# Patient Record
Sex: Female | Born: 1975 | Race: Black or African American | Hispanic: No | Marital: Married | Smoking: Current every day smoker
Health system: Southern US, Community
[De-identification: ages and names within clinical notes are randomized; demographics above are authoritative.]

## PROBLEM LIST (undated history)

## (undated) DIAGNOSIS — I10 Essential (primary) hypertension: Secondary | ICD-10-CM

## (undated) HISTORY — PX: BACK SURGERY: SHX140

---

## 1998-03-22 ENCOUNTER — Encounter: Admission: RE | Admit: 1998-03-22 | Discharge: 1998-03-22 | Payer: Self-pay | Admitting: Family Medicine

## 1998-04-03 ENCOUNTER — Encounter: Admission: RE | Admit: 1998-04-03 | Discharge: 1998-04-03 | Payer: Self-pay | Admitting: Family Medicine

## 1998-04-10 ENCOUNTER — Encounter: Admission: RE | Admit: 1998-04-10 | Discharge: 1998-04-10 | Payer: Self-pay | Admitting: Family Medicine

## 1998-04-22 ENCOUNTER — Encounter: Admission: RE | Admit: 1998-04-22 | Discharge: 1998-04-22 | Payer: Self-pay | Admitting: Family Medicine

## 1998-05-07 ENCOUNTER — Encounter: Admission: RE | Admit: 1998-05-07 | Discharge: 1998-05-07 | Payer: Self-pay | Admitting: Family Medicine

## 1998-05-15 ENCOUNTER — Encounter: Admission: RE | Admit: 1998-05-15 | Discharge: 1998-05-15 | Payer: Self-pay | Admitting: Family Medicine

## 1998-06-11 ENCOUNTER — Encounter: Admission: RE | Admit: 1998-06-11 | Discharge: 1998-06-11 | Payer: Self-pay | Admitting: Family Medicine

## 1998-06-18 ENCOUNTER — Encounter: Admission: RE | Admit: 1998-06-18 | Discharge: 1998-06-18 | Payer: Self-pay | Admitting: Family Medicine

## 1998-06-26 ENCOUNTER — Ambulatory Visit (HOSPITAL_COMMUNITY): Admission: RE | Admit: 1998-06-26 | Discharge: 1998-06-26 | Payer: Self-pay | Admitting: Family Medicine

## 1998-06-29 ENCOUNTER — Inpatient Hospital Stay (HOSPITAL_COMMUNITY): Admission: AD | Admit: 1998-06-29 | Discharge: 1998-06-29 | Payer: Self-pay | Admitting: Obstetrics & Gynecology

## 1998-07-05 ENCOUNTER — Encounter: Admission: RE | Admit: 1998-07-05 | Discharge: 1998-07-05 | Payer: Self-pay | Admitting: Family Medicine

## 1998-07-23 ENCOUNTER — Encounter: Admission: RE | Admit: 1998-07-23 | Discharge: 1998-07-23 | Payer: Self-pay | Admitting: Sports Medicine

## 1998-08-07 ENCOUNTER — Encounter: Admission: RE | Admit: 1998-08-07 | Discharge: 1998-08-07 | Payer: Self-pay | Admitting: Family Medicine

## 1998-08-07 ENCOUNTER — Inpatient Hospital Stay (HOSPITAL_COMMUNITY): Admission: AD | Admit: 1998-08-07 | Discharge: 1998-08-07 | Payer: Self-pay | Admitting: Obstetrics

## 1998-08-22 ENCOUNTER — Encounter: Admission: RE | Admit: 1998-08-22 | Discharge: 1998-08-22 | Payer: Self-pay | Admitting: Family Medicine

## 1998-08-30 ENCOUNTER — Encounter: Admission: RE | Admit: 1998-08-30 | Discharge: 1998-08-30 | Payer: Self-pay | Admitting: Family Medicine

## 1998-09-04 ENCOUNTER — Encounter: Admission: RE | Admit: 1998-09-04 | Discharge: 1998-09-04 | Payer: Self-pay | Admitting: Family Medicine

## 1998-09-09 ENCOUNTER — Encounter: Admission: RE | Admit: 1998-09-09 | Discharge: 1998-09-09 | Payer: Self-pay | Admitting: Family Medicine

## 1998-09-09 ENCOUNTER — Encounter: Admission: RE | Admit: 1998-09-09 | Discharge: 1998-12-08 | Payer: Self-pay | Admitting: Family Medicine

## 1998-10-07 ENCOUNTER — Inpatient Hospital Stay (HOSPITAL_COMMUNITY): Admission: RE | Admit: 1998-10-07 | Discharge: 1998-10-07 | Payer: Self-pay | Admitting: Obstetrics

## 1998-10-10 ENCOUNTER — Encounter: Admission: RE | Admit: 1998-10-10 | Discharge: 1998-10-10 | Payer: Self-pay | Admitting: Obstetrics

## 1998-10-14 ENCOUNTER — Encounter (HOSPITAL_COMMUNITY): Admission: RE | Admit: 1998-10-14 | Discharge: 1999-01-12 | Payer: Self-pay | Admitting: Obstetrics & Gynecology

## 1998-10-15 ENCOUNTER — Encounter: Admission: RE | Admit: 1998-10-15 | Discharge: 1998-10-15 | Payer: Self-pay | Admitting: Sports Medicine

## 1998-10-17 ENCOUNTER — Encounter: Admission: RE | Admit: 1998-10-17 | Discharge: 1998-10-17 | Payer: Self-pay | Admitting: Obstetrics

## 1998-10-28 ENCOUNTER — Ambulatory Visit (HOSPITAL_COMMUNITY): Admission: RE | Admit: 1998-10-28 | Discharge: 1998-10-28 | Payer: Self-pay | Admitting: Obstetrics

## 1998-11-03 ENCOUNTER — Inpatient Hospital Stay (HOSPITAL_COMMUNITY): Admission: AD | Admit: 1998-11-03 | Discharge: 1998-11-03 | Payer: Self-pay | Admitting: Obstetrics

## 1998-11-07 ENCOUNTER — Encounter: Admission: RE | Admit: 1998-11-07 | Discharge: 1998-11-07 | Payer: Self-pay | Admitting: Obstetrics

## 1998-11-14 ENCOUNTER — Encounter: Admission: RE | Admit: 1998-11-14 | Discharge: 1998-11-14 | Payer: Self-pay | Admitting: Obstetrics & Gynecology

## 1998-11-20 ENCOUNTER — Encounter: Admission: RE | Admit: 1998-11-20 | Discharge: 1998-11-20 | Payer: Self-pay | Admitting: Hematology and Oncology

## 1998-11-27 ENCOUNTER — Encounter: Admission: RE | Admit: 1998-11-27 | Discharge: 1998-11-27 | Payer: Self-pay | Admitting: Obstetrics & Gynecology

## 1998-12-04 ENCOUNTER — Inpatient Hospital Stay: Admission: AD | Admit: 1998-12-04 | Discharge: 1998-12-04 | Payer: Self-pay | Admitting: Obstetrics

## 1998-12-12 ENCOUNTER — Encounter: Admission: RE | Admit: 1998-12-12 | Discharge: 1998-12-12 | Payer: Self-pay | Admitting: Obstetrics

## 1998-12-18 ENCOUNTER — Inpatient Hospital Stay (HOSPITAL_COMMUNITY): Admission: AD | Admit: 1998-12-18 | Discharge: 1998-12-18 | Payer: Self-pay | Admitting: Obstetrics

## 1998-12-20 ENCOUNTER — Inpatient Hospital Stay (HOSPITAL_COMMUNITY): Admission: AD | Admit: 1998-12-20 | Discharge: 1998-12-20 | Payer: Self-pay | Admitting: *Deleted

## 1998-12-21 ENCOUNTER — Inpatient Hospital Stay (HOSPITAL_COMMUNITY): Admission: AD | Admit: 1998-12-21 | Discharge: 1998-12-23 | Payer: Self-pay | Admitting: *Deleted

## 1999-01-28 ENCOUNTER — Other Ambulatory Visit: Admission: RE | Admit: 1999-01-28 | Discharge: 1999-01-28 | Payer: Self-pay | Admitting: Family Medicine

## 1999-01-28 ENCOUNTER — Encounter: Admission: RE | Admit: 1999-01-28 | Discharge: 1999-01-28 | Payer: Self-pay | Admitting: Family Medicine

## 1999-02-21 ENCOUNTER — Encounter: Admission: RE | Admit: 1999-02-21 | Discharge: 1999-02-21 | Payer: Self-pay | Admitting: Family Medicine

## 1999-03-24 ENCOUNTER — Encounter: Admission: RE | Admit: 1999-03-24 | Discharge: 1999-03-24 | Payer: Self-pay | Admitting: Family Medicine

## 1999-06-09 ENCOUNTER — Encounter: Admission: RE | Admit: 1999-06-09 | Discharge: 1999-06-09 | Payer: Self-pay | Admitting: Family Medicine

## 1999-06-20 ENCOUNTER — Encounter: Admission: RE | Admit: 1999-06-20 | Discharge: 1999-06-20 | Payer: Self-pay | Admitting: Family Medicine

## 1999-06-27 ENCOUNTER — Encounter: Admission: RE | Admit: 1999-06-27 | Discharge: 1999-06-27 | Payer: Self-pay | Admitting: Family Medicine

## 1999-07-04 ENCOUNTER — Other Ambulatory Visit: Admission: RE | Admit: 1999-07-04 | Discharge: 1999-07-04 | Payer: Self-pay | Admitting: *Deleted

## 1999-07-04 ENCOUNTER — Encounter: Admission: RE | Admit: 1999-07-04 | Discharge: 1999-07-04 | Payer: Self-pay | Admitting: Family Medicine

## 1999-08-19 ENCOUNTER — Encounter: Admission: RE | Admit: 1999-08-19 | Discharge: 1999-08-19 | Payer: Self-pay | Admitting: Family Medicine

## 1999-09-30 ENCOUNTER — Encounter: Admission: RE | Admit: 1999-09-30 | Discharge: 1999-09-30 | Payer: Self-pay | Admitting: Sports Medicine

## 1999-09-30 ENCOUNTER — Other Ambulatory Visit: Admission: RE | Admit: 1999-09-30 | Discharge: 1999-09-30 | Payer: Self-pay | Admitting: Family Medicine

## 1999-10-08 ENCOUNTER — Encounter: Admission: RE | Admit: 1999-10-08 | Discharge: 1999-10-08 | Payer: Self-pay | Admitting: Family Medicine

## 1999-10-15 ENCOUNTER — Encounter: Admission: RE | Admit: 1999-10-15 | Discharge: 1999-10-15 | Payer: Self-pay | Admitting: Family Medicine

## 1999-11-18 ENCOUNTER — Encounter: Admission: RE | Admit: 1999-11-18 | Discharge: 1999-11-18 | Payer: Self-pay | Admitting: Sports Medicine

## 1999-11-21 ENCOUNTER — Encounter: Admission: RE | Admit: 1999-11-21 | Discharge: 1999-11-21 | Payer: Self-pay | Admitting: Family Medicine

## 1999-12-09 ENCOUNTER — Encounter: Admission: RE | Admit: 1999-12-09 | Discharge: 1999-12-09 | Payer: Self-pay | Admitting: Sports Medicine

## 1999-12-17 ENCOUNTER — Encounter: Admission: RE | Admit: 1999-12-17 | Discharge: 1999-12-17 | Payer: Self-pay | Admitting: Family Medicine

## 2000-01-02 ENCOUNTER — Encounter: Admission: RE | Admit: 2000-01-02 | Discharge: 2000-01-02 | Payer: Self-pay | Admitting: Family Medicine

## 2000-01-16 ENCOUNTER — Encounter: Admission: RE | Admit: 2000-01-16 | Discharge: 2000-01-16 | Payer: Self-pay | Admitting: Family Medicine

## 2000-02-18 ENCOUNTER — Encounter: Admission: RE | Admit: 2000-02-18 | Discharge: 2000-02-18 | Payer: Self-pay | Admitting: Family Medicine

## 2000-03-18 ENCOUNTER — Encounter: Admission: RE | Admit: 2000-03-18 | Discharge: 2000-03-18 | Payer: Self-pay | Admitting: Family Medicine

## 2000-03-30 ENCOUNTER — Encounter: Admission: RE | Admit: 2000-03-30 | Discharge: 2000-03-30 | Payer: Self-pay | Admitting: Sports Medicine

## 2000-04-01 ENCOUNTER — Ambulatory Visit (HOSPITAL_COMMUNITY): Admission: RE | Admit: 2000-04-01 | Discharge: 2000-04-01 | Payer: Self-pay

## 2000-04-13 ENCOUNTER — Encounter: Admission: RE | Admit: 2000-04-13 | Discharge: 2000-04-13 | Payer: Self-pay | Admitting: Sports Medicine

## 2000-04-16 ENCOUNTER — Encounter: Admission: RE | Admit: 2000-04-16 | Discharge: 2000-04-16 | Payer: Self-pay | Admitting: Family Medicine

## 2000-06-21 ENCOUNTER — Encounter: Admission: RE | Admit: 2000-06-21 | Discharge: 2000-06-21 | Payer: Self-pay | Admitting: Family Medicine

## 2000-08-05 ENCOUNTER — Encounter: Payer: Self-pay | Admitting: *Deleted

## 2000-08-05 ENCOUNTER — Encounter: Admission: RE | Admit: 2000-08-05 | Discharge: 2000-08-05 | Payer: Self-pay | Admitting: Family Medicine

## 2000-08-05 ENCOUNTER — Encounter: Admission: RE | Admit: 2000-08-05 | Discharge: 2000-08-05 | Payer: Self-pay | Admitting: *Deleted

## 2001-02-18 ENCOUNTER — Encounter: Admission: RE | Admit: 2001-02-18 | Discharge: 2001-02-18 | Payer: Self-pay | Admitting: Family Medicine

## 2001-03-02 ENCOUNTER — Encounter: Admission: RE | Admit: 2001-03-02 | Discharge: 2001-03-02 | Payer: Self-pay | Admitting: Family Medicine

## 2001-03-04 ENCOUNTER — Encounter: Admission: RE | Admit: 2001-03-04 | Discharge: 2001-03-04 | Payer: Self-pay | Admitting: Family Medicine

## 2001-07-19 ENCOUNTER — Encounter: Admission: RE | Admit: 2001-07-19 | Discharge: 2001-07-19 | Payer: Self-pay | Admitting: Family Medicine

## 2001-08-30 ENCOUNTER — Encounter (INDEPENDENT_AMBULATORY_CARE_PROVIDER_SITE_OTHER): Payer: Self-pay | Admitting: *Deleted

## 2001-08-30 LAB — CONVERTED CEMR LAB

## 2001-09-15 ENCOUNTER — Encounter: Admission: RE | Admit: 2001-09-15 | Discharge: 2001-09-15 | Payer: Self-pay | Admitting: Family Medicine

## 2001-09-23 ENCOUNTER — Encounter: Admission: RE | Admit: 2001-09-23 | Discharge: 2001-09-23 | Payer: Self-pay | Admitting: Family Medicine

## 2001-09-23 ENCOUNTER — Other Ambulatory Visit: Admission: RE | Admit: 2001-09-23 | Discharge: 2001-09-23 | Payer: Self-pay | Admitting: Family Medicine

## 2001-10-13 ENCOUNTER — Encounter: Admission: RE | Admit: 2001-10-13 | Discharge: 2001-10-13 | Payer: Self-pay | Admitting: Family Medicine

## 2001-10-26 ENCOUNTER — Encounter: Admission: RE | Admit: 2001-10-26 | Discharge: 2001-10-26 | Payer: Self-pay | Admitting: Pediatrics

## 2001-11-11 ENCOUNTER — Encounter: Admission: RE | Admit: 2001-11-11 | Discharge: 2001-11-11 | Payer: Self-pay | Admitting: Sports Medicine

## 2001-11-11 ENCOUNTER — Encounter: Payer: Self-pay | Admitting: Sports Medicine

## 2002-01-23 ENCOUNTER — Encounter: Admission: RE | Admit: 2002-01-23 | Discharge: 2002-01-23 | Payer: Self-pay | Admitting: Family Medicine

## 2002-02-10 ENCOUNTER — Emergency Department (HOSPITAL_COMMUNITY): Admission: EM | Admit: 2002-02-10 | Discharge: 2002-02-10 | Payer: Self-pay | Admitting: Emergency Medicine

## 2002-02-15 ENCOUNTER — Encounter: Admission: RE | Admit: 2002-02-15 | Discharge: 2002-02-15 | Payer: Self-pay | Admitting: Family Medicine

## 2002-07-28 ENCOUNTER — Encounter: Admission: RE | Admit: 2002-07-28 | Discharge: 2002-07-28 | Payer: Self-pay | Admitting: Family Medicine

## 2002-08-01 ENCOUNTER — Encounter: Admission: RE | Admit: 2002-08-01 | Discharge: 2002-08-01 | Payer: Self-pay | Admitting: Family Medicine

## 2002-08-04 ENCOUNTER — Encounter: Admission: RE | Admit: 2002-08-04 | Discharge: 2002-08-04 | Payer: Self-pay | Admitting: Family Medicine

## 2003-01-05 ENCOUNTER — Encounter: Admission: RE | Admit: 2003-01-05 | Discharge: 2003-01-05 | Payer: Self-pay | Admitting: Family Medicine

## 2003-03-04 ENCOUNTER — Emergency Department (HOSPITAL_COMMUNITY): Admission: EM | Admit: 2003-03-04 | Discharge: 2003-03-04 | Payer: Self-pay | Admitting: Emergency Medicine

## 2003-03-04 ENCOUNTER — Encounter: Payer: Self-pay | Admitting: Emergency Medicine

## 2003-11-16 ENCOUNTER — Encounter: Admission: RE | Admit: 2003-11-16 | Discharge: 2003-11-16 | Payer: Self-pay | Admitting: Obstetrics

## 2003-11-16 ENCOUNTER — Ambulatory Visit (HOSPITAL_COMMUNITY): Admission: RE | Admit: 2003-11-16 | Discharge: 2003-11-16 | Payer: Self-pay | Admitting: Obstetrics

## 2003-11-19 ENCOUNTER — Encounter: Admission: RE | Admit: 2003-11-19 | Discharge: 2003-11-19 | Payer: Self-pay | Admitting: Obstetrics

## 2003-11-21 ENCOUNTER — Inpatient Hospital Stay (HOSPITAL_COMMUNITY): Admission: AD | Admit: 2003-11-21 | Discharge: 2003-11-23 | Payer: Self-pay | Admitting: Obstetrics

## 2004-11-07 ENCOUNTER — Ambulatory Visit (HOSPITAL_COMMUNITY): Admission: RE | Admit: 2004-11-07 | Discharge: 2004-11-07 | Payer: Self-pay | Admitting: Obstetrics

## 2004-11-23 ENCOUNTER — Observation Stay (HOSPITAL_COMMUNITY): Admission: AD | Admit: 2004-11-23 | Discharge: 2004-11-24 | Payer: Self-pay | Admitting: Obstetrics

## 2005-01-11 ENCOUNTER — Inpatient Hospital Stay (HOSPITAL_COMMUNITY): Admission: AD | Admit: 2005-01-11 | Discharge: 2005-01-13 | Payer: Self-pay | Admitting: Obstetrics

## 2005-04-10 ENCOUNTER — Inpatient Hospital Stay (HOSPITAL_COMMUNITY): Admission: AD | Admit: 2005-04-10 | Discharge: 2005-04-10 | Payer: Self-pay | Admitting: Obstetrics

## 2005-04-13 ENCOUNTER — Inpatient Hospital Stay (HOSPITAL_COMMUNITY): Admission: AD | Admit: 2005-04-13 | Discharge: 2005-04-13 | Payer: Self-pay | Admitting: Obstetrics

## 2005-04-22 ENCOUNTER — Inpatient Hospital Stay (HOSPITAL_COMMUNITY): Admission: AD | Admit: 2005-04-22 | Discharge: 2005-04-24 | Payer: Self-pay | Admitting: Obstetrics

## 2005-04-23 ENCOUNTER — Encounter (INDEPENDENT_AMBULATORY_CARE_PROVIDER_SITE_OTHER): Payer: Self-pay | Admitting: Specialist

## 2007-01-28 ENCOUNTER — Encounter (INDEPENDENT_AMBULATORY_CARE_PROVIDER_SITE_OTHER): Payer: Self-pay | Admitting: *Deleted

## 2007-01-30 ENCOUNTER — Emergency Department (HOSPITAL_COMMUNITY): Admission: EM | Admit: 2007-01-30 | Discharge: 2007-01-30 | Payer: Self-pay | Admitting: Emergency Medicine

## 2008-08-29 ENCOUNTER — Ambulatory Visit: Payer: Self-pay | Admitting: Nurse Practitioner

## 2008-08-29 DIAGNOSIS — N61 Mastitis without abscess: Secondary | ICD-10-CM

## 2008-08-29 DIAGNOSIS — I10 Essential (primary) hypertension: Secondary | ICD-10-CM

## 2008-08-29 DIAGNOSIS — F17200 Nicotine dependence, unspecified, uncomplicated: Secondary | ICD-10-CM

## 2008-08-30 ENCOUNTER — Encounter (INDEPENDENT_AMBULATORY_CARE_PROVIDER_SITE_OTHER): Payer: Self-pay | Admitting: Nurse Practitioner

## 2008-09-12 ENCOUNTER — Encounter (INDEPENDENT_AMBULATORY_CARE_PROVIDER_SITE_OTHER): Payer: Self-pay | Admitting: *Deleted

## 2008-10-16 ENCOUNTER — Encounter (INDEPENDENT_AMBULATORY_CARE_PROVIDER_SITE_OTHER): Payer: Self-pay | Admitting: *Deleted

## 2009-06-04 ENCOUNTER — Ambulatory Visit (HOSPITAL_COMMUNITY): Admission: RE | Admit: 2009-06-04 | Discharge: 2009-06-04 | Payer: Self-pay | Admitting: Cardiology

## 2009-06-06 ENCOUNTER — Ambulatory Visit (HOSPITAL_COMMUNITY): Admission: RE | Admit: 2009-06-06 | Discharge: 2009-06-06 | Payer: Self-pay | Admitting: Cardiology

## 2009-10-22 ENCOUNTER — Emergency Department (HOSPITAL_COMMUNITY): Admission: EM | Admit: 2009-10-22 | Discharge: 2009-10-22 | Payer: Self-pay | Admitting: Emergency Medicine

## 2009-12-16 ENCOUNTER — Emergency Department (HOSPITAL_COMMUNITY): Admission: EM | Admit: 2009-12-16 | Discharge: 2009-12-16 | Payer: Self-pay | Admitting: Emergency Medicine

## 2009-12-30 ENCOUNTER — Emergency Department (HOSPITAL_COMMUNITY): Admission: EM | Admit: 2009-12-30 | Discharge: 2009-12-30 | Payer: Self-pay | Admitting: Emergency Medicine

## 2010-03-26 ENCOUNTER — Emergency Department (HOSPITAL_COMMUNITY): Admission: EM | Admit: 2010-03-26 | Discharge: 2010-03-26 | Payer: Self-pay | Admitting: Emergency Medicine

## 2010-04-07 ENCOUNTER — Observation Stay (HOSPITAL_COMMUNITY): Admission: EM | Admit: 2010-04-07 | Discharge: 2010-04-08 | Payer: Self-pay | Admitting: Emergency Medicine

## 2010-05-06 ENCOUNTER — Emergency Department (HOSPITAL_COMMUNITY): Admission: EM | Admit: 2010-05-06 | Discharge: 2010-05-06 | Payer: Self-pay | Admitting: Emergency Medicine

## 2010-07-17 IMAGING — CR DG LUMBAR SPINE 1V
1 series · 1 of 1 positions shown · non-contrast
Comparison: Lumbar spine MRI 04/07/2010.

CLINICAL DATA: L5-S1 disc herniation.

LUMBAR SPINE - 1 VIEW

[view not recorded]
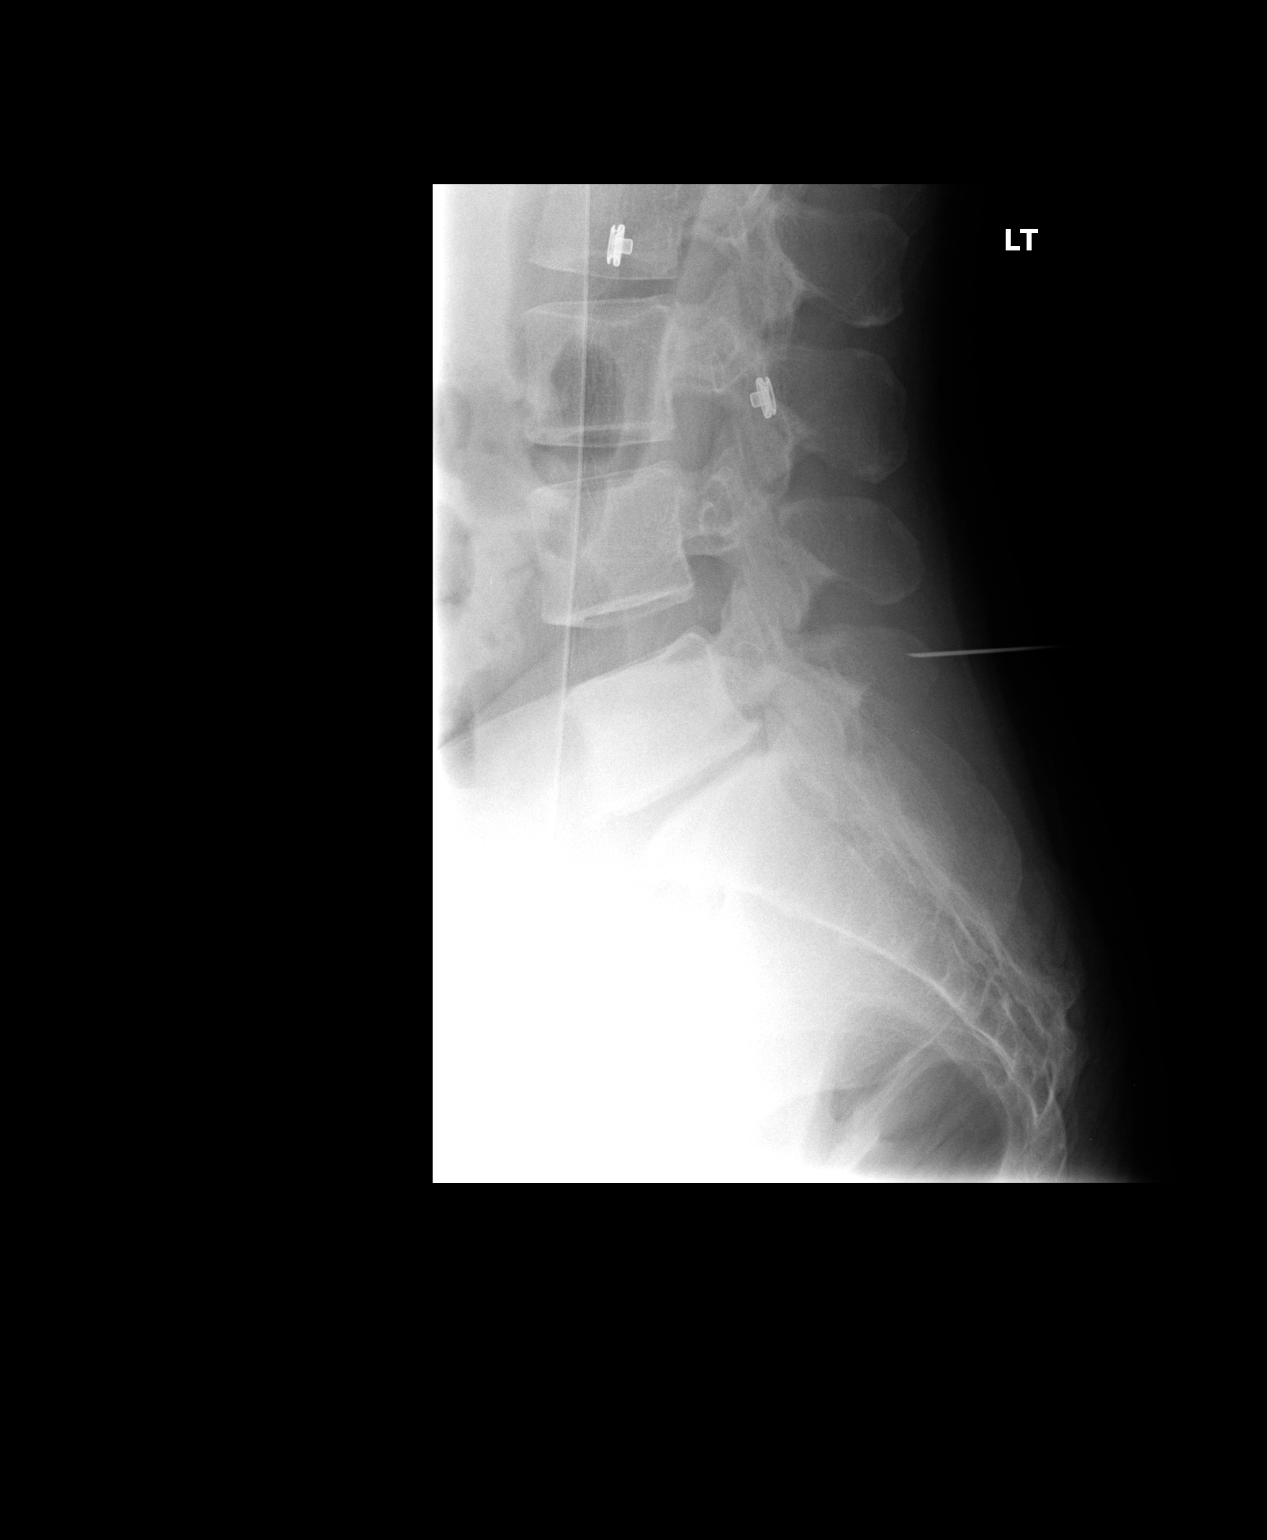

[1 of 1 positions shown; findings below may reference images not displayed]

FINDINGS: Lateral lumbar spine film from the operating room
demonstrates a spinal needle projecting at the L5 spinous process.
IMPRESSION: L5 spinous process marked intraoperatively.

## 2010-12-20 ENCOUNTER — Encounter: Payer: Self-pay | Admitting: Obstetrics

## 2011-02-16 LAB — URINALYSIS, ROUTINE W REFLEX MICROSCOPIC
Bilirubin Urine: NEGATIVE
Glucose, UA: NEGATIVE mg/dL
Ketones, ur: NEGATIVE mg/dL
Nitrite: NEGATIVE
Protein, ur: 30 mg/dL — AB
Specific Gravity, Urine: 1.012 (ref 1.005–1.030)
Urobilinogen, UA: 0.2 mg/dL (ref 0.0–1.0)
pH: 7.5 (ref 5.0–8.0)

## 2011-02-16 LAB — DIFFERENTIAL
Basophils Absolute: 0 10*3/uL (ref 0.0–0.1)
Basophils Relative: 0 % (ref 0–1)
Eosinophils Absolute: 0 10*3/uL (ref 0.0–0.7)
Eosinophils Relative: 0 % (ref 0–5)
Lymphocytes Relative: 5 % — ABNORMAL LOW (ref 12–46)
Lymphs Abs: 0.7 10*3/uL (ref 0.7–4.0)
Monocytes Absolute: 0.2 10*3/uL (ref 0.1–1.0)
Monocytes Relative: 2 % — ABNORMAL LOW (ref 3–12)
Neutro Abs: 11.9 10*3/uL — ABNORMAL HIGH (ref 1.7–7.7)
Neutrophils Relative %: 93 % — ABNORMAL HIGH (ref 43–77)

## 2011-02-16 LAB — BASIC METABOLIC PANEL
BUN: 8 mg/dL (ref 6–23)
CO2: 24 mEq/L (ref 19–32)
Calcium: 9.1 mg/dL (ref 8.4–10.5)
Chloride: 106 mEq/L (ref 96–112)
Creatinine, Ser: 0.7 mg/dL (ref 0.4–1.2)
GFR calc Af Amer: >60 mL/min (ref >60)
GFR calc non Af Amer: >60 mL/min (ref >60)
Glucose, Bld: 98 mg/dL (ref 70–99)
Potassium: 3.4 mEq/L — ABNORMAL LOW (ref 3.5–5.1)
Sodium: 139 mEq/L (ref 135–145)

## 2011-02-16 LAB — URINE CULTURE: Colony Count: >=100000

## 2011-02-16 LAB — URINE MICROSCOPIC-ADD ON

## 2011-02-16 LAB — CBC
HCT: 38 % (ref 36.0–46.0)
Hemoglobin: 13.1 g/dL (ref 12.0–15.0)
MCHC: 34.3 g/dL (ref 30.0–36.0)
MCV: 88.3 fL (ref 78.0–100.0)
Platelets: 216 10*3/uL (ref 150–400)
RBC: 4.31 MIL/uL (ref 3.87–5.11)
RDW: 13 % (ref 11.5–15.5)
WBC: 12.8 10*3/uL — ABNORMAL HIGH (ref 4.0–10.5)

## 2011-02-17 LAB — DIFFERENTIAL
Basophils Absolute: 0 10*3/uL (ref 0.0–0.1)
Basophils Relative: 0 % (ref 0–1)
Eosinophils Absolute: 0 10*3/uL (ref 0.0–0.7)
Eosinophils Relative: 0 % (ref 0–5)
Lymphocytes Relative: 26 % (ref 12–46)
Lymphs Abs: 2.2 10*3/uL (ref 0.7–4.0)
Monocytes Absolute: 0.5 10*3/uL (ref 0.1–1.0)
Monocytes Relative: 6 % (ref 3–12)
Neutro Abs: 5.8 10*3/uL (ref 1.7–7.7)
Neutrophils Relative %: 67 % (ref 43–77)

## 2011-02-17 LAB — PROTIME-INR
INR: 1.02 (ref 0.00–1.49)
Prothrombin Time: 13.3 seconds (ref 11.6–15.2)

## 2011-02-17 LAB — CBC
HCT: 40.4 % (ref 36.0–46.0)
Hemoglobin: 14 g/dL (ref 12.0–15.0)
MCHC: 34.7 g/dL (ref 30.0–36.0)
MCV: 88.8 fL (ref 78.0–100.0)
Platelets: 220 K/uL (ref 150–400)
RBC: 4.55 MIL/uL (ref 3.87–5.11)
RDW: 12.7 % (ref 11.5–15.5)
WBC: 8.6 K/uL (ref 4.0–10.5)

## 2011-02-17 LAB — BASIC METABOLIC PANEL WITH GFR
BUN: 3 mg/dL — ABNORMAL LOW (ref 6–23)
CO2: 25 meq/L (ref 19–32)
Calcium: 9.1 mg/dL (ref 8.4–10.5)
Chloride: 107 meq/L (ref 96–112)
Creatinine, Ser: 0.57 mg/dL (ref 0.4–1.2)
GFR calc non Af Amer: >60 mL/min
Glucose, Bld: 88 mg/dL (ref 70–99)
Potassium: 3.5 meq/L (ref 3.5–5.1)
Sodium: 139 meq/L (ref 135–145)

## 2011-02-17 LAB — APTT: aPTT: 30 seconds (ref 24–37)

## 2011-03-04 LAB — COMPREHENSIVE METABOLIC PANEL
ALT: 9 U/L (ref 0–35)
AST: 14 U/L (ref 0–37)
Albumin: 3.8 g/dL (ref 3.5–5.2)
Alkaline Phosphatase: 43 U/L (ref 39–117)
BUN: 7 mg/dL (ref 6–23)
CO2: 24 mEq/L (ref 19–32)
Calcium: 9 mg/dL (ref 8.4–10.5)
Chloride: 107 mEq/L (ref 96–112)
Creatinine, Ser: 0.58 mg/dL (ref 0.4–1.2)
GFR calc Af Amer: >60 mL/min (ref >60)
GFR calc non Af Amer: >60 mL/min (ref >60)
Glucose, Bld: 97 mg/dL (ref 70–99)
Potassium: 4 mEq/L (ref 3.5–5.1)
Sodium: 137 mEq/L (ref 135–145)
Total Bilirubin: 0.5 mg/dL (ref 0.3–1.2)
Total Protein: 7.7 g/dL (ref 6.0–8.3)

## 2011-03-04 LAB — DIFFERENTIAL
Basophils Relative: 1 % (ref 0–1)
Eosinophils Absolute: 0.2 10*3/uL (ref 0.0–0.7)
Lymphs Abs: 1.5 10*3/uL (ref 0.7–4.0)
Monocytes Absolute: 0.3 10*3/uL (ref 0.1–1.0)
Monocytes Relative: 4 % (ref 3–12)

## 2011-03-04 LAB — CBC
HCT: 41.5 % (ref 36.0–46.0)
Hemoglobin: 14.1 g/dL (ref 12.0–15.0)
MCHC: 33.9 g/dL (ref 30.0–36.0)
MCV: 88.6 fL (ref 78.0–100.0)
Platelets: 241 10*3/uL (ref 150–400)
RBC: 4.68 MIL/uL (ref 3.87–5.11)
RDW: 12.7 % (ref 11.5–15.5)
WBC: 7.4 10*3/uL (ref 4.0–10.5)

## 2011-03-08 LAB — COMPREHENSIVE METABOLIC PANEL
ALT: 11 U/L (ref 0–35)
AST: 17 U/L (ref 0–37)
Albumin: 4 g/dL (ref 3.5–5.2)
Alkaline Phosphatase: 46 U/L (ref 39–117)
CO2: 25 mEq/L (ref 19–32)
Chloride: 102 mEq/L (ref 96–112)
GFR calc Af Amer: >60 mL/min (ref >60)
GFR calc non Af Amer: >60 mL/min (ref >60)
Potassium: 3.6 mEq/L (ref 3.5–5.1)
Sodium: 135 mEq/L (ref 135–145)
Total Bilirubin: 0.9 mg/dL (ref 0.3–1.2)

## 2011-03-08 LAB — URINALYSIS, MICROSCOPIC ONLY
Bilirubin Urine: NEGATIVE
Glucose, UA: NEGATIVE mg/dL
Hgb urine dipstick: NEGATIVE
Protein, ur: NEGATIVE mg/dL
Specific Gravity, Urine: 1.019 (ref 1.005–1.030)
Urobilinogen, UA: 1 mg/dL (ref 0.0–1.0)

## 2011-03-08 LAB — CBC
MCV: 86.9 fL (ref 78.0–100.0)
Platelets: 202 10*3/uL (ref 150–400)
RBC: 4.71 MIL/uL (ref 3.87–5.11)
WBC: 8.3 10*3/uL (ref 4.0–10.5)

## 2011-04-17 NOTE — Op Note (Signed)
NAME:  Kerry Parker, Kerry Parker NO.:  [id number]   MEDICAL RECORD NO.:  [id number]          PATIENT TYPE:  INP   LOCATION:  9105                          FACILITY:  [facility]   PHYSICIAN:  Kathreen Cosier, M.D.DATE OF BIRTH:  June 04, 1976   DATE OF PROCEDURE:  04/22/2005  DATE OF DISCHARGE:                                 OPERATIVE REPORT   PREOPERATIVE DIAGNOSIS:  Multiparity.   POSTOPERATIVE DIAGNOSIS:  Multiparity.   PROCEDURE:  Postpartum tubal ligation.   DESCRIPTION OF PROCEDURE:  Using the epidural with the patient in the supine  position, abdomen prepped and draped.  Bladder emptied with a straight  catheter.  A midline subumbilical incision 1 inch long was made and carried  down to the fascia.  The fascia was cleaned and incised the length of the  incision.  The fascia and peritoneum opened with Mayo scissors.  The left  tube grasped in the midportion with the Babcock clamp and the tube traced to  the fimbria.  0 plain suture placed in the mesosalpinx below the portion of  the tube within the clamp.  This was tied and approximately 1 inch of tube  transected.  The procedure was done in a similar fashion on the other side.  Lap and sponge counts were correct.  Abdomen was closed in layers.  The  peritoneum and fascia with continuous suture of 0 Dexon.  Skin closed with  subcuticular stitch of 4-0 Monocryl.  Estimated blood loss less than 5 mL.  The patient tolerated the procedure well and was taken to the recovery room  in good condition.      BAM/MEDQ  D:  04/23/2005  T:  04/23/2005  Job:  [id number]
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
## 2013-02-01 ENCOUNTER — Emergency Department (HOSPITAL_COMMUNITY)
Admission: EM | Admit: 2013-02-01 | Discharge: 2013-02-01 | Disposition: A | Discharge status: Home / Self Care | Payer: Self-pay | Attending: Emergency Medicine | Admitting: Emergency Medicine

## 2013-02-01 ENCOUNTER — Encounter (HOSPITAL_COMMUNITY): Payer: Self-pay | Admitting: *Deleted

## 2013-02-01 DIAGNOSIS — M542 Cervicalgia: Secondary | ICD-10-CM

## 2013-02-01 DIAGNOSIS — F17200 Nicotine dependence, unspecified, uncomplicated: Secondary | ICD-10-CM | POA: Insufficient documentation

## 2013-02-01 DIAGNOSIS — S0990XA Unspecified injury of head, initial encounter: Secondary | ICD-10-CM | POA: Insufficient documentation

## 2013-02-01 DIAGNOSIS — S0993XA Unspecified injury of face, initial encounter: Secondary | ICD-10-CM | POA: Insufficient documentation

## 2013-02-01 DIAGNOSIS — Y92410 Unspecified street and highway as the place of occurrence of the external cause: Secondary | ICD-10-CM | POA: Insufficient documentation

## 2013-02-01 DIAGNOSIS — Y9389 Activity, other specified: Secondary | ICD-10-CM | POA: Insufficient documentation

## 2013-02-01 MED ORDER — IBUPROFEN 400 MG PO TABS
400.0000 mg | ORAL_TABLET | Freq: Once | ORAL | Status: AC
  Administered 2013-02-01: 400 mg via ORAL
  Filled 2013-02-01: qty 1

## 2013-02-01 MED ORDER — DIAZEPAM 5 MG PO TABS
5.0000 mg | ORAL_TABLET | Freq: Once | ORAL | Status: AC
  Administered 2013-02-01: 5 mg via ORAL
  Filled 2013-02-01: qty 1

## 2013-02-01 MED ORDER — NAPROXEN 500 MG PO TABS: 500.0000 mg | ORAL_TABLET | Freq: Two times a day (BID) | ORAL | Status: DC

## 2013-02-01 MED ORDER — DIAZEPAM 5 MG PO TABS: 5.0000 mg | ORAL_TABLET | Freq: Four times a day (QID) | ORAL | Status: DC | PRN
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
## 2013-02-01 NOTE — ED Notes (Signed)
Pt was informed of the reason for CT scan and the risk she would take of not getting the CT scan. Pt cont to refuse to have CT scan done and requested to be discharged.
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
## 2013-02-03 NOTE — ED Provider Notes (Signed)
Medical screening examination/treatment/procedure(s) were performed by non-physician practitioner and as supervising physician I was immediately available for consultation/collaboration.   Joya Gaskins, MD 02/03/13 [phone]

## 2014-06-07 ENCOUNTER — Emergency Department (HOSPITAL_COMMUNITY)
Admission: EM | Admit: 2014-06-07 | Discharge: 2014-06-07 | Disposition: A | Discharge status: Home / Self Care | Payer: No Typology Code available for payment source | Attending: Emergency Medicine | Admitting: Emergency Medicine

## 2014-06-07 ENCOUNTER — Encounter (HOSPITAL_COMMUNITY): Payer: Self-pay | Admitting: Emergency Medicine

## 2014-06-07 DIAGNOSIS — R51 Headache: Secondary | ICD-10-CM | POA: Insufficient documentation

## 2014-06-07 DIAGNOSIS — H60399 Other infective otitis externa, unspecified ear: Secondary | ICD-10-CM | POA: Insufficient documentation

## 2014-06-07 DIAGNOSIS — F17200 Nicotine dependence, unspecified, uncomplicated: Secondary | ICD-10-CM | POA: Insufficient documentation

## 2014-06-07 DIAGNOSIS — H6091 Unspecified otitis externa, right ear: Secondary | ICD-10-CM

## 2014-06-07 MED ORDER — OXYCODONE-ACETAMINOPHEN 5-325 MG PO TABS
1.0000 | ORAL_TABLET | Freq: Once | ORAL | Status: AC
  Administered 2014-06-07: 1 via ORAL
  Filled 2014-06-07: qty 1

## 2014-06-07 MED ORDER — ANTIPYRINE-BENZOCAINE 5.4-1.4 % OT SOLN
3.0000 [drp] | Freq: Once | OTIC | Status: AC
  Administered 2014-06-07: 4 [drp] via OTIC
  Filled 2014-06-07: qty 10

## 2014-06-07 MED ORDER — HYDROMORPHONE HCL PF 1 MG/ML IJ SOLN
1.0000 mg | Freq: Once | INTRAMUSCULAR | Status: AC
  Administered 2014-06-07: 1 mg via INTRAMUSCULAR
  Filled 2014-06-07: qty 1

## 2014-06-07 MED ORDER — CIPROFLOXACIN-DEXAMETHASONE 0.3-0.1 % OT SUSP
4.0000 [drp] | Freq: Once | OTIC | Status: AC
  Administered 2014-06-07: 4 [drp] via OTIC
  Filled 2014-06-07: qty 7.5

## 2014-06-07 MED ORDER — HYDROCODONE-ACETAMINOPHEN 5-325 MG PO TABS: 1.0000 | ORAL_TABLET | Freq: Four times a day (QID) | ORAL | Status: DC | PRN

## 2014-06-07 NOTE — ED Notes (Signed)
Pt reports she is 6 weeks preg with spotting; has not had prenatal care yet.

## 2014-06-07 NOTE — Discharge Instructions (Signed)
You have outer ear infection.  For the first 1-2 days, insert wick into ear canal, drop 4 drops of antibiotic every 4-6 hrs.  The medication should help decrease ear swelling and subsequently you may not need the wick to administer medicinal drop.  Apply auralgan every 2-4 hrs as needed for pain.  Take pain medication as prescribed.  Follow up with ear doctor for further care if no improvement.    Otitis Externa Otitis externa is a bacterial or fungal infection of the outer ear canal. This is the area from the eardrum to the outside of the ear. Otitis externa is sometimes called "swimmer's ear." CAUSES  Possible causes of infection include:  Swimming in dirty water.  Moisture remaining in the ear after swimming or bathing.  Mild injury (trauma) to the ear.  Objects stuck in the ear (foreign body).  Cuts or scrapes (abrasions) on the outside of the ear. SYMPTOMS  The first symptom of infection is often itching in the ear canal. Later signs and symptoms may include swelling and redness of the ear canal, ear pain, and yellowish-white fluid (pus) coming from the ear. The ear pain may be worse when pulling on the earlobe. DIAGNOSIS  Your caregiver will perform a physical exam. A sample of fluid may be taken from the ear and examined for bacteria or fungi. TREATMENT  Antibiotic ear drops are often given for 10 to 14 days. Treatment may also include pain medicine or corticosteroids to reduce itching and swelling. PREVENTION   Keep your ear dry. Use the corner of a towel to absorb water out of the ear canal after swimming or bathing.  Avoid scratching or putting objects inside your ear. This can damage the ear canal or remove the protective wax that lines the canal. This makes it easier for bacteria and fungi to grow.  Avoid swimming in lakes, polluted water, or poorly chlorinated pools.  You may use ear drops made of rubbing alcohol and vinegar after swimming. Combine equal parts of white  vinegar and alcohol in a bottle. Put 3 or 4 drops into each ear after swimming. HOME CARE INSTRUCTIONS   Apply antibiotic ear drops to the ear canal as prescribed by your caregiver.  Only take over-the-counter or prescription medicines for pain, discomfort, or fever as directed by your caregiver.  If you have diabetes, follow any additional treatment instructions from your caregiver.  Keep all follow-up appointments as directed by your caregiver. SEEK MEDICAL CARE IF:   You have a fever.  Your ear is still red, swollen, painful, or draining pus after 3 days.  Your redness, swelling, or pain gets worse.  You have a severe headache.  You have redness, swelling, pain, or tenderness in the area behind your ear. MAKE SURE YOU:   Understand these instructions.  Will watch your condition.  Will get help right away if you are not doing well or get worse. Document Released: 11/16/2005 Document Revised: 02/08/2012 Document Reviewed: 12/03/2011 [facility] Patient Information 2015 [employer], [place]. This information is not intended to replace advice given to you by your health care provider. Make sure you discuss any questions you have with your health care provider.

## 2014-06-07 NOTE — ED Provider Notes (Signed)
CSN: [id number]     Arrival date & time 06/07/14  4098 History   First MD Initiated Contact with Patient 06/07/14 [phone]     Chief Complaint  Patient presents with  . Otalgia  . Neck Pain     (Consider location/radiation/quality/duration/timing/severity/associated sxs/prior Treatment) HPI  38 year old female with history of hypertension who presents primarily complaining of right ear pain. Patient does report gradual onset progressively worsening right ear pain for the past 4-5 days. Describe pain as a sharp and aching sensation, radiates to jaw and R side of neck, persistent, 10/10.  Worsening with jaw movement, swallow, eat, talk or palpation.  BP has increased due to pain.  No fever, chills, vision change, sneeze, runny nose, cp, sob, productive cough.  No sore throat.  Denies using Qtip on regular basis, no swimming in lakes/pond/pool.  No discharge on pillow.  Report difficulty hearing from R ear but no tinnitus.  Does have hx of HTN, not taking any medication for it.    History reviewed. No pertinent past medical history. History reviewed. No pertinent past surgical history. History reviewed. No pertinent family history. History  Substance Use Topics  . Smoking status: Current Every Day Smoker  . Smokeless tobacco: Not on file  . Alcohol Use: No   OB History   Grav Para Term Preterm Abortions TAB SAB Ect Mult Living                 Review of Systems  Constitutional: Negative for fever.  HENT: Positive for ear pain. Negative for facial swelling, rhinorrhea and sore throat.   Skin: Negative for rash.  Neurological: Positive for headaches. Negative for numbness.      Allergies  Review of patient's allergies indicates no known allergies.  Home Medications   Prior to Admission medications   Medication Sig Start Date End Date Taking? Authorizing Provider  ibuprofen (ADVIL,MOTRIN) 200 MG tablet Take 800 mg by mouth every 6 (six) hours as needed for moderate pain.   Yes  Historical Provider, MD  Olive Oil (SWEET OIL) OIL 5 drops by Does not apply route 3 (three) times daily as needed (pain).   Yes Historical Provider, MD   BP 200/117  Pulse 91  Temp(Src) 98.2 F (36.8 C)  Resp 18  SpO2 100%  LMP 05/14/2014 Physical Exam  Nursing note and vitals reviewed. Constitutional: She is oriented to person, place, and time. She appears well-developed and well-nourished. She appears distressed (Patient is tearful).  HENT:  Head: Atraumatic.  Mouth/Throat: Oropharynx is clear and moist.  R ear: ear lobes exquisitely tender throughout without redness noted.  Ear canal markedly edematous and occluded, unable to visualized TM.  Post auricular lymphadenopathy, ttp.    L ear: cerumen impaction, nontender.    Eyes: Conjunctivae are normal.  Neck: Neck supple.  R postauricular lymphadenopathy, with markedly tender.  No meningismal sign.    Cardiovascular: Normal rate and regular rhythm.   Pulmonary/Chest: Effort normal and breath sounds normal.  Abdominal: Soft. There is no tenderness.  Neurological: She is alert and oriented to person, place, and time.  Skin: No rash noted.  Psychiatric: She has a normal mood and affect.    ED Course  Procedures (including critical care time)  10:14 AM Patient complaint of right ear pain. Patient has apparent otitis externa as evidenced by physical exam. She is hypertensive, but admits to not taking any blood pressure medication. Her elevated BP maybe secondary to her ear pain, which is causing significant discomfort  at this time. We'll provide treatment  1:02 PM Pt has otitis externa, ear wick applied, pain medication and antibiotic given with significant improvement of sxs.  Care instruction provided. ENT referral given.  Return precaution discussed.  Pt made aware to have BP recheck by PCP for further management.    Labs Review Labs Reviewed - No data to display  Imaging Review No results found.   EKG  Interpretation None      MDM   Final diagnoses:  Otitis externa of right ear    BP 160/98  Pulse 58  Temp(Src) 98.2 F (36.8 C)  Resp 13  SpO2 98%  LMP 05/14/2014     Fayrene HelperBowie Loras Grieshop, PA-C 06/07/14 1303

## 2014-06-07 NOTE — ED Notes (Signed)
Per pt sts she is having right ear pain for the past 4 days. sts now the pain is radiating down the side of her right neck and hurts to swallow. sts hard to swallow and she has to spit things back up. Pt hypertensive at triage checked twice.

## 2014-06-07 NOTE — ED Notes (Signed)
PA updated; pt tolerated irrigation well. Small flecks of earwax removed.

## 2014-06-09 NOTE — ED Provider Notes (Signed)
Medical screening examination/treatment/procedure(s) were performed by non-physician practitioner and as supervising physician I was immediately available for consultation/collaboration.   EKG Interpretation None        Laray AngerKathleen M Jahquez Steffler, DO 06/09/14 1439

## 2016-03-16 ENCOUNTER — Ambulatory Visit (HOSPITAL_COMMUNITY)
Admission: EM | Admit: 2016-03-16 | Discharge: 2016-03-16 | Disposition: A | Discharge status: Home / Self Care | Payer: PRIVATE HEALTH INSURANCE | Attending: Family Medicine | Admitting: Family Medicine

## 2016-03-16 ENCOUNTER — Encounter (HOSPITAL_COMMUNITY): Payer: Self-pay | Admitting: Emergency Medicine

## 2016-03-16 DIAGNOSIS — H6122 Impacted cerumen, left ear: Secondary | ICD-10-CM | POA: Diagnosis not present

## 2016-03-16 DIAGNOSIS — H9202 Otalgia, left ear: Secondary | ICD-10-CM

## 2016-03-16 DIAGNOSIS — H6692 Otitis media, unspecified, left ear: Secondary | ICD-10-CM | POA: Diagnosis not present

## 2016-03-16 MED ORDER — AMOXICILLIN 500 MG PO CAPS: 1000.0000 mg | ORAL_CAPSULE | Freq: Two times a day (BID) | ORAL | Status: DC

## 2016-03-16 MED ORDER — HYDROCODONE-ACETAMINOPHEN 5-325 MG PO TABS: 1.0000 | ORAL_TABLET | ORAL | Status: DC | PRN

## 2016-03-16 NOTE — Discharge Instructions (Signed)
Otitis Media, Adult Otitis media is redness, soreness, and inflammation of the middle ear. Otitis media may be caused by allergies or, most commonly, by infection. Often it occurs as a complication of the common cold. SIGNS AND SYMPTOMS Symptoms of otitis media may include:  Earache.  Fever.  Ringing in your ear.  Headache.  Leakage of fluid from the ear. DIAGNOSIS To diagnose otitis media, your health care provider will examine your ear with an otoscope. This is an instrument that allows your health care provider to see into your ear in order to examine your eardrum. Your health care provider also will ask you questions about your symptoms. TREATMENT  Typically, otitis media resolves on its own within 3-5 days. Your health care provider may prescribe medicine to ease your symptoms of pain. If otitis media does not resolve within 5 days or is recurrent, your health care provider may prescribe antibiotic medicines if he or she suspects that a bacterial infection is the cause. HOME CARE INSTRUCTIONS   If you were prescribed an antibiotic medicine, finish it all even if you start to feel better.  Take medicines only as directed by your health care provider.  Keep all follow-up visits as directed by your health care provider. SEEK MEDICAL CARE IF:  You have otitis media only in one ear, or bleeding from your nose, or both.  You notice a lump on your neck.  You are not getting better in 3-5 days.  You feel worse instead of better. SEEK IMMEDIATE MEDICAL CARE IF:   You have pain that is not controlled with medicine.  You have swelling, redness, or pain around your ear or stiffness in your neck.  You notice that part of your face is paralyzed.  You notice that the bone behind your ear (mastoid) is tender when you touch it. MAKE SURE YOU:   Understand these instructions.  Will watch your condition.  Will get help right away if you are not doing well or get worse.   This  information is not intended to replace advice given to you by your health care provider. Make sure you discuss any questions you have with your health care provider.   Document Released: 08/21/2004 Document Revised: 12/07/2014 Document Reviewed: 06/13/2013 Elsevier Interactive Patient Education 2016 [employer].  Otitis Media, Adult Otitis media is redness, soreness, and puffiness (swelling) in the space just behind your eardrum (middle ear). It may be caused by allergies or infection. It often happens along with a cold. HOME CARE  Take your medicine as told. Finish it even if you start to feel better.  Only take over-the-counter or prescription medicines for pain, discomfort, or fever as told by your doctor.  Follow up with your doctor as told. GET HELP IF:  You have otitis media only in one ear, or bleeding from your nose, or both.  You notice a lump on your neck.  You are not getting better in 3-5 days.  You feel worse instead of better. GET HELP RIGHT AWAY IF:   You have pain that is not helped with medicine.  You have puffiness, redness, or pain around your ear.  You get a stiff neck.  You cannot move part of your face (paralysis).  You notice that the bone behind your ear hurts when you touch it. MAKE SURE YOU:   Understand these instructions.  Will watch your condition.  Will get help right away if you are not doing well or get worse.   This  information is not intended to replace advice given to you by your health care provider. Make sure you discuss any questions you have with your health care provider.   Document Released: 05/04/2008 Document Revised: 12/07/2014 Document Reviewed: 06/13/2013 Elsevier Interactive Patient Education 2016 Elsevier Inc.  Earache An earache, also called otalgia, can be caused by many things. Pain from an earache can be sharp, dull, or burning. The pain may be temporary or constant. Earaches can be caused by problems with the  ear, such as infection in either the middle ear or the ear canal, injury, impacted ear wax, middle ear pressure, or a foreign body in the ear. Ear pain can also result from problems in other areas. This is called referred pain. For example, pain can come from a sore throat, a tooth infection, or problems with the jaw or the joint between the jaw and the skull (temporomandibular joint, or TMJ). The cause of an earache is not always easy to identify. Watchful waiting may be appropriate for some earaches until a clear cause of the pain can be found. HOME CARE INSTRUCTIONS Watch your condition for any changes. The following actions may help to lessen any discomfort that you are feeling:  Take medicines only as directed by your health care provider. This includes ear drops.  Apply ice to your outer ear to help reduce pain.  Put ice in a plastic bag.  Place a towel between your skin and the bag.  Leave the ice on for 20 minutes, 2-3 times per day.  Do not put anything in your ear other than medicine that is prescribed by your health care provider.  Try resting in an upright position instead of lying down. This may help to reduce pressure in the middle ear and relieve pain.  Chew gum if it helps to relieve your ear pain.  Control any allergies that you have.  Keep all follow-up visits as directed by your health care provider. This is important. SEEK MEDICAL CARE IF:  Your pain does not improve within 2 days.  You have a fever.  You have new or worsening symptoms. SEEK IMMEDIATE MEDICAL CARE IF:  You have a severe headache.  You have a stiff neck.  You have difficulty swallowing.  You have redness or swelling behind your ear.  You have drainage from your ear.  You have hearing loss.  You feel dizzy.   This information is not intended to replace advice given to you by your health care provider. Make sure you discuss any questions you have with your health care provider.     Document Released: 07/03/2004 Document Revised: 12/07/2014 Document Reviewed: 06/17/2014 Elsevier Interactive Patient Education [employer] Elsevier Inc.

## 2016-03-16 NOTE — ED Provider Notes (Signed)
CSN: [id number]     Arrival date & time 03/16/16  1837 History   First MD Initiated Contact with Patient 03/16/16 1958     Chief Complaint  Patient presents with  . Otalgia   (Consider location/radiation/quality/duration/timing/severity/associated sxs/prior Treatment) HPI Comments: 40 year old female complaining of left earache for 2 days. She states that she has ear pain when air rushes into her ear. She occasionally has chills but no fever. She also has a runny nose.   History reviewed. No pertinent past medical history. History reviewed. No pertinent past surgical history. No family history on file. Social History  Substance Use Topics  . Smoking status: Current Every Day Smoker  . Smokeless tobacco: None  . Alcohol Use: No   OB History    No data available     Review of Systems  Constitutional: Negative.  Negative for fever.  HENT: Positive for congestion and ear pain. Negative for ear discharge, sore throat and trouble swallowing.   Eyes: Negative.   Respiratory: Negative.     Allergies  Review of patient's allergies indicates no known allergies.  Home Medications   Prior to Admission medications   Medication Sig Start Date End Date Taking? Authorizing Provider  amoxicillin (AMOXIL) 500 MG capsule Take 2 capsules (1,000 mg total) by mouth 2 (two) times daily. 03/16/16   Hayden Rasmussen, NP  HYDROcodone-acetaminophen (NORCO/VICODIN) 5-325 MG tablet Take 1 tablet by mouth every 4 (four) hours as needed. 03/16/16   Hayden Rasmussen, NP  ibuprofen (ADVIL,MOTRIN) 200 MG tablet Take 800 mg by mouth every 6 (six) hours as needed for moderate pain.    Historical Provider, MD  Olive Oil (SWEET OIL) OIL 5 drops by Does not apply route 3 (three) times daily as needed (pain).    Historical Provider, MD   Meds Ordered and Administered this Visit  Medications - No data to display  BP 169/112 mmHg  Pulse 88  Temp(Src) 97.9 F (36.6 C)  Resp 18  SpO2 99%  LMP 01/31/2016 No data  found.   Physical Exam  Constitutional: She appears well-developed and well-nourished. No distress.  HENT:  Head: Normocephalic and atraumatic.  Bilateral TMs are obscured by cerumen in the EACs. The cerumen extends to the meatus and the left ear. Gentle touch to the left outer ear as well as the area behind the ear and posterior to the jaw line produces severe tenderness.no apparent swelling. No erythema.  Neck: Normal range of motion. Neck supple.  Cardiovascular: Normal rate.   Pulmonary/Chest: Effort normal. No respiratory distress.  Lymphadenopathy:    She has no cervical adenopathy.  Neurological: She is alert. She exhibits normal muscle tone.  Skin: Skin is warm and dry.  Nursing note and vitals reviewed.   ED Course  .Ear Cerumen Removal Date/Time: 03/16/2016 8:30 PM Performed by: Phineas Real, Kweli Grassel Authorized by: Bradd Canary D Consent: Verbal consent obtained. Risks and benefits: risks, benefits and alternatives were discussed Consent given by: patient Patient understanding: patient states understanding of the procedure being performed Patient identity confirmed: verbally with patient Local anesthetic: none Location details: left ear Procedure type: irrigation Patient sedated: no Comments: Procedure was painful. Pt also very emotional.   (including critical care time)  Labs Review Labs Reviewed - No data to display  Imaging Review No results found.   Visual Acuity Review  Right Eye Distance:   Left Eye Distance:   Bilateral Distance:    Right Eye Near:   Left Eye Near:    Bilateral Near:  MDM   1. Otalgia of left ear   2. Otitis, left     Most of the cerumen was irrigated from the left ear. Patient continues to cry and complain of pain in the ear and soft tissue below the left ear. Most of the wax has been cleared however not sufficient enough to visualize the TM. Due to the amount of pain she is having will presume she has an otitis and treat  empirically with ABX Meds ordered this encounter  Medications  . amoxicillin (AMOXIL) 500 MG capsule    Sig: Take 2 capsules (1,000 mg total) by mouth 2 (two) times daily.    Dispense:  40 capsule    Refill:  0    Order Specific Question:  Supervising Provider    Answer:  Linna HoffKINDL, JAMES D [phone][[id number]]  . HYDROcodone-acetaminophen (NORCO/VICODIN) 5-325 MG tablet    Sig: Take 1 tablet by mouth every 4 (four) hours as needed.    Dispense:  15 tablet    Refill:  0    Order Specific Question:  Supervising Provider    Answer:  Linna HoffKINDL, JAMES D [[id number]]   May also take ibuprofen Pt noted to be tearful since arrival. Told nurse of personal problems, depressive stresses, anxiety.   Hayden Rasmussenavid Bonni Neuser, NP 03/16/16 [id number]  Hayden Rasmussenavid Danilynn Jemison, NP 03/16/16 2050

## 2016-03-16 NOTE — ED Notes (Signed)
Left ear pain for 2 days.  Reports runny nose and chills, denies fever, denies cough.  Swallowing is painful.

## 2017-02-19 ENCOUNTER — Encounter (HOSPITAL_COMMUNITY): Payer: Self-pay | Admitting: Emergency Medicine

## 2017-02-19 ENCOUNTER — Emergency Department (HOSPITAL_COMMUNITY)
Admission: EM | Admit: 2017-02-19 | Discharge: 2017-02-19 | Disposition: A | Discharge status: Home / Self Care | Payer: No Typology Code available for payment source | Attending: Emergency Medicine | Admitting: Emergency Medicine

## 2017-02-19 DIAGNOSIS — S199XXA Unspecified injury of neck, initial encounter: Secondary | ICD-10-CM | POA: Diagnosis present

## 2017-02-19 DIAGNOSIS — Y929 Unspecified place or not applicable: Secondary | ICD-10-CM | POA: Diagnosis not present

## 2017-02-19 DIAGNOSIS — I10 Essential (primary) hypertension: Secondary | ICD-10-CM | POA: Diagnosis not present

## 2017-02-19 DIAGNOSIS — F17210 Nicotine dependence, cigarettes, uncomplicated: Secondary | ICD-10-CM | POA: Insufficient documentation

## 2017-02-19 DIAGNOSIS — Y939 Activity, unspecified: Secondary | ICD-10-CM | POA: Diagnosis not present

## 2017-02-19 DIAGNOSIS — S161XXA Strain of muscle, fascia and tendon at neck level, initial encounter: Secondary | ICD-10-CM | POA: Diagnosis not present

## 2017-02-19 DIAGNOSIS — M5412 Radiculopathy, cervical region: Secondary | ICD-10-CM | POA: Diagnosis not present

## 2017-02-19 DIAGNOSIS — X58XXXA Exposure to other specified factors, initial encounter: Secondary | ICD-10-CM | POA: Diagnosis not present

## 2017-02-19 DIAGNOSIS — T148XXA Other injury of unspecified body region, initial encounter: Secondary | ICD-10-CM

## 2017-02-19 DIAGNOSIS — Z79899 Other long term (current) drug therapy: Secondary | ICD-10-CM | POA: Insufficient documentation

## 2017-02-19 DIAGNOSIS — Y999 Unspecified external cause status: Secondary | ICD-10-CM | POA: Insufficient documentation

## 2017-02-19 HISTORY — DX: Essential (primary) hypertension: I10

## 2017-02-19 MED ORDER — HYDROCODONE-ACETAMINOPHEN 5-325 MG PO TABS: 1.0000 | ORAL_TABLET | ORAL | 0 refills | Status: DC | PRN

## 2017-02-19 MED ORDER — NAPROXEN 500 MG PO TABS: 500.0000 mg | ORAL_TABLET | Freq: Two times a day (BID) | ORAL | 0 refills | Status: DC

## 2017-02-19 MED ORDER — DIAZEPAM 5 MG PO TABS
5.0000 mg | ORAL_TABLET | Freq: Once | ORAL | Status: AC
  Administered 2017-02-19: 5 mg via ORAL
  Filled 2017-02-19: qty 1

## 2017-02-19 MED ORDER — OXYCODONE-ACETAMINOPHEN 5-325 MG PO TABS
1.0000 | ORAL_TABLET | Freq: Once | ORAL | Status: AC
  Administered 2017-02-19: 1 via ORAL
  Filled 2017-02-19: qty 1

## 2017-02-19 MED ORDER — DIAZEPAM 5 MG PO TABS: 5.0000 mg | ORAL_TABLET | Freq: Two times a day (BID) | ORAL | 0 refills | Status: DC

## 2017-02-19 NOTE — ED Triage Notes (Signed)
Pt has been having left arm pain for a month. Pt also complaining of left sided neck pain as well. Denies any other symptoms. Neuro intact

## 2017-02-19 NOTE — ED Provider Notes (Signed)
[facility]-EMERGENCY DEPT Provider Note   CSN: [id number] Arrival date & time: 02/19/17  [id number]     History   Chief Complaint Chief Complaint  Patient presents with  . Neck Pain  . Arm Pain    HPI Kerry Parker is a 41 y.o. female.  HPI   Patient is a 41 year old female with no pertinent past medical history presents the ED with complaint of left neck and arm pain, onset 1 month. Patient reports over the past months she has had gradually worsening constant sharp shooting pain from the left side of her neck down to her left arm and into all of her fingers. Patient reports the pain is worse with movement of her left arm. She also reports having intermittent tingling to all of her left fingertips which she notes resolve after shaking her left hand for a few seconds. She reports initially being evaluated at an urgent care a few weeks ago and states she was given anti-inflammatories which she had been taking without improvement. She also reports taking Tylenol at home without relief. Patient notes over the past few days she began having pain to the left side of her upper back which she notes is also worse with movement. Patient denies any recent fall, injury, trauma, MVC, new exercise, heavy lifting or strenuous activity.  Pt denies fever, headache, dizziness, visual changes, neck stiffness, chest pain, shortness of breath, abdominal pain, vomiting, swelling, numbness, tingling, saddle anesthesia, loss of bowel or bladder, weakness, IVDU, cancer or recent spinal manipulation. Patient reports having a lumbar discectomy performed over 10 years ago, otherwise denies any spinal problems.  Pt does not currently have a PCP.   Past Medical History:  Diagnosis Date  . Hypertension     Patient Active Problem List   Diagnosis Date Noted  . TOBACCO ABUSE 08/29/2008  . HYPERTENSION, BENIGN ESSENTIAL 08/29/2008  . MASTITIS 08/29/2008    Past Surgical History:  Procedure Laterality Date  . BACK  SURGERY      OB History    No data available       Home Medications    Prior to Admission medications   Medication Sig Start Date End Date Taking? Authorizing Provider  amoxicillin (AMOXIL) 500 MG capsule Take 2 capsules (1,000 mg total) by mouth 2 (two) times daily. 03/16/16   Hayden Rasmussenavid Mabe, NP  diazepam (VALIUM) 5 MG tablet Take 1 tablet (5 mg total) by mouth 2 (two) times daily. 02/19/17   Barrett HenleNicole Elizabeth Malaki Koury, PA-C  HYDROcodone-acetaminophen (NORCO/VICODIN) 5-325 MG tablet Take 1 tablet by mouth every 4 (four) hours as needed. 02/19/17   Barrett HenleNicole Elizabeth Sami Froh, PA-C  ibuprofen (ADVIL,MOTRIN) 200 MG tablet Take 800 mg by mouth every 6 (six) hours as needed for moderate pain.    Historical Provider, MD  naproxen (NAPROSYN) 500 MG tablet Take 1 tablet (500 mg total) by mouth 2 (two) times daily. 02/19/17   Barrett HenleNicole Elizabeth Jerrion Tabbert, PA-C  Olive Oil (SWEET OIL) OIL 5 drops by Does not apply route 3 (three) times daily as needed (pain).    Historical Provider, MD    Family History History reviewed. No pertinent family history.  Social History Social History  Substance Use Topics  . Smoking status: Current Every Day Smoker    Types: Cigarettes  . Smokeless tobacco: Never Used  . Alcohol use No     Allergies   Patient has no known allergies.   Review of Systems Review of Systems  Musculoskeletal: Positive for myalgias (left arm) and  neck pain.  Neurological: Positive for numbness.  All other systems reviewed and are negative.    Physical Exam Updated Vital Signs BP (!) 166/104 (BP Location: Right Arm)   Pulse 75   Temp 98.6 F (37 C) (Oral)   Resp 15   Ht 5\' 1"  (1.549 m)   Wt 84.8 kg   LMP 02/15/2017   SpO2 100%   BMI 35.33 kg/m   Physical Exam  Constitutional: She is oriented to person, place, and time. She appears well-developed and well-nourished. No distress.  HENT:  Head: Normocephalic and atraumatic.  Eyes: Conjunctivae and EOM are normal. Pupils are  equal, round, and reactive to light. Right eye exhibits no discharge. Left eye exhibits no discharge. No scleral icterus.  Neck: Normal range of motion. Neck supple.  Cardiovascular: Normal rate, regular rhythm, normal heart sounds and intact distal pulses.   Pulmonary/Chest: Effort normal and breath sounds normal. No respiratory distress. She has no wheezes. She has no rales. She exhibits no tenderness.  Abdominal: Soft. Bowel sounds are normal. She exhibits no distension and no mass. There is no tenderness. There is no rebound and no guarding. No hernia.  Musculoskeletal: Normal range of motion. She exhibits tenderness. She exhibits no edema or deformity.       Left shoulder: She exhibits tenderness. She exhibits normal range of motion, no swelling, no effusion, no crepitus, no deformity, no laceration, normal pulse and normal strength.       Back:  No midline C, T, or L tenderness. TTP over left cervical paraspinal muscles, left trapezius, left rhomboids, and left upper thoracic paraspinal muscles with palpable muscle spasm noted througout. Full range of motion of neck and back with reported pain. Full range of motion of bilateral upper and lower extremities, with 5/5 strength. TTP over left deltoid. Pt endorses pain to left neck/arm with ROM of left shoulder. Sensation intact. 2+ radial and PT pulses. Cap refill <2 seconds. Patient able to stand and ambulate without assistance.    Neurological: She is alert and oriented to person, place, and time.  Skin: Skin is warm and dry. She is not diaphoretic.  Nursing note and vitals reviewed.    ED Treatments / Results  Labs (all labs ordered are listed, but only abnormal results are displayed) Labs Reviewed - No data to display  EKG  EKG Interpretation None       Radiology No results found.  Procedures Procedures (including critical care time)  Medications Ordered in ED Medications  diazepam (VALIUM) tablet 5 mg (not administered)    oxyCODONE-acetaminophen (PERCOCET/ROXICET) 5-325 MG per tablet 1 tablet (not administered)     Initial Impression / Assessment and Plan / ED Course  I have reviewed the triage vital signs and the nursing notes.  Pertinent labs & imaging results that were available during my care of the patient were reviewed by me and considered in my medical decision making (see chart for details).     Patient presents with left-sided neck and left arm pain which has been constant and gradually worsening for the past month. Denies any known fall, trauma, injury, strenuous activity or heavy lifting. Denies fever or neck stiffness. VSS. Exam revealed tenderness over the musculature of left neck, left upper back and left shoulder. No midline spinal tenderness. Full range of motion of bilateral upper and lower extremities with 5 out of 5 strength. Sensation grossly intact. Bilateral upper and lower extremities neurovascularly intact. Suspect patient's symptoms are likely due to possible impinged  cervical nerve with secondary muscle strain/spasm. Due to pt without any neurological sxs are this time I do not feel that imaging is warranted at this time. Plan to d/c pt home with muscle relaxants, pain meds, anti-inflammatories and symptomatic tx. Advised pt to f/u with PCP as needed. Pt given contact info to follow up with her neurosurgeon if sxs do not improve or worsen. Discussed strict return precautions.   Pt's BP 166/104. Patient reports possible history of hypertension but denies taking any medications. Patient denies headache, visual changes, lightheadedness, dizziness, shortness of breath, chest pain, abdominal pain, tingling, weakness. No signs of hypertensive emergency or urgency at this time. Consulted case management to help set pt with with PCP. Advised patient to follow up with her PCP in one week to have her blood pressure rechecked for further management of HTN.   Final Clinical Impressions(s) / ED Diagnoses    Final diagnoses:  Cervical radiculopathy  Muscle strain    New Prescriptions New Prescriptions   DIAZEPAM (VALIUM) 5 MG TABLET    Take 1 tablet (5 mg total) by mouth 2 (two) times daily.   HYDROCODONE-ACETAMINOPHEN (NORCO/VICODIN) 5-325 MG TABLET    Take 1 tablet by mouth every 4 (four) hours as needed.   NAPROXEN (NAPROSYN) 500 MG TABLET    Take 1 tablet (500 mg total) by mouth 2 (two) times daily.     Satira Sark [place], [place] 02/19/17 1610    Alvira Monday, MD 02/23/17 [phone]

## 2017-02-19 NOTE — Discharge Instructions (Signed)
Take your medications as prescribed as needed for pain relief. I recommend refrain from doing any repetitive movement, strenuous activity or heavy lifting for the next week and today her symptoms have improved. Follow-up with the primary care office listed below her symptoms have not completely resolved. I recommend following up with your neurosurgeon, Dr. Danielle DessElsner, the next week if your symptoms have not improved or worsen. Please return to the Emergency Department if symptoms worsen or new onset of fever, headache, dizziness, neck stiffness, swelling, weakness, numbness, low back pain, loss of control of bowel or bladder, unable to ambulate, groin numbness.

## 2017-02-19 NOTE — Discharge Planning (Signed)
Late entry: Kerry Ocallaghan J. Lucretia RoersWood, RN, BSN, [place] [phone]  [facility] with Sindy Messingoger Gomez, NP on 3/27 @0830 .  Spoke with pt at bedside and advised to please arrive 15 min early and take a picture ID and your current medications.  Pt verbalizes understanding of keeping appointment.

## 2017-02-23 ENCOUNTER — Inpatient Hospital Stay (INDEPENDENT_AMBULATORY_CARE_PROVIDER_SITE_OTHER): Payer: PRIVATE HEALTH INSURANCE | Admitting: Physician Assistant

## 2017-03-02 ENCOUNTER — Ambulatory Visit (INDEPENDENT_AMBULATORY_CARE_PROVIDER_SITE_OTHER): Payer: PRIVATE HEALTH INSURANCE | Admitting: Physician Assistant

## 2017-03-05 ENCOUNTER — Encounter (INDEPENDENT_AMBULATORY_CARE_PROVIDER_SITE_OTHER): Payer: Self-pay | Admitting: Physician Assistant

## 2017-03-05 ENCOUNTER — Ambulatory Visit (INDEPENDENT_AMBULATORY_CARE_PROVIDER_SITE_OTHER): Payer: PRIVATE HEALTH INSURANCE | Admitting: Physician Assistant

## 2017-03-05 VITALS — BP 155/108 | HR 74 | Temp 98.1°F | Ht 61.0 in | Wt 181.8 lb

## 2017-03-05 DIAGNOSIS — I10 Essential (primary) hypertension: Secondary | ICD-10-CM | POA: Diagnosis not present

## 2017-03-05 DIAGNOSIS — S39012A Strain of muscle, fascia and tendon of lower back, initial encounter: Secondary | ICD-10-CM | POA: Diagnosis not present

## 2017-03-05 DIAGNOSIS — F418 Other specified anxiety disorders: Secondary | ICD-10-CM | POA: Insufficient documentation

## 2017-03-05 MED ORDER — LISINOPRIL-HYDROCHLOROTHIAZIDE 10-12.5 MG PO TABS: 1.0000 | ORAL_TABLET | Freq: Every day | ORAL | 3 refills | Status: AC

## 2017-03-05 MED ORDER — CYCLOBENZAPRINE HCL 10 MG PO TABS: 10.0000 mg | ORAL_TABLET | Freq: Three times a day (TID) | ORAL | 0 refills | Status: DC | PRN

## 2017-03-05 MED ORDER — ACETAMINOPHEN-CODEINE #3 300-30 MG PO TABS: 1.0000 | ORAL_TABLET | ORAL | 0 refills | Status: AC | PRN

## 2017-03-05 MED ORDER — SERTRALINE HCL 50 MG PO TABS: 50.0000 mg | ORAL_TABLET | Freq: Every day | ORAL | 3 refills | Status: AC

## 2017-03-05 NOTE — Patient Instructions (Signed)
Major Depressive Disorder, Adult Major depressive disorder (MDD) is a mental health condition. MDD often makes you feel sad, hopeless, or helpless. MDD can also cause symptoms in your body. MDD can affect your:  Work.  School.  Relationships.  Other normal activities. MDD can range from mild to very bad. It may occur once (single episode MDD). It can also occur many times (recurrent MDD). The main symptoms of MDD often include:  Feeling sad, depressed, or irritable most of the time.  Loss of interest. MDD symptoms also include:  Sleeping too much or too little.  Eating too much or too little.  A change in your weight.  Feeling tired (fatigue) or having low energy.  Feeling worthless.  Feeling guilty.  Trouble making decisions.  Trouble thinking clearly.  Thoughts of suicide or harming others.  Feeling weak.  Feeling agitated.  Keeping yourself from being around other people (isolation). Follow these instructions at home: Activity   Do these things as told by your doctor:  Go back to your normal activities.  Exercise regularly.  Spend time outdoors. Alcohol   Talk with your doctor about how alcohol can affect your antidepressant medicines.  Do not drink alcohol. Or, limit how much alcohol you drink.  This means no more than 1 drink a day for nonpregnant women and 2 drinks a day for men. One drink equals one of these:  12 oz of beer.  5 oz of wine.  1 oz of hard liquor. General instructions   Take over-the-counter and prescription medicines only as told by your doctor.  Eat a healthy diet.  Get plenty of sleep.  Find activities that you enjoy. Make time to do them.  Think about joining a support group. Your doctor may be able to suggest a group for you.  Keep all follow-up visits as told by your doctor. This is important. Where to find more information:   [employer] on Mental Illness:  www.nami.org  [employer] of  Mental Health:  http://www.maynard.net/  National Suicide Prevention Lifeline:  3673569255. This is free, 24-hour help. Contact a doctor if:  Your symptoms get worse.  You have new symptoms. Get help right away if:  You self-harm.  You see, hear, taste, smell, or feel things that are not present (hallucinate). If you ever feel like you may hurt yourself or others, or have thoughts about taking your own life, get help right away. You can go to your nearest emergency department or call:  Your local emergency services (911 in the [place]  A suicide crisis helpline, such as the National Suicide Prevention Lifeline:  (336)285-5372. This is open 24 hours a day. This information is not intended to replace advice given to you by your health care provider. Make sure you discuss any questions you have with your health care provider. Document Released: 10/28/2015 Document Revised: 08/02/2016 Document Reviewed: 08/02/2016 Elsevier Interactive Patient Education  2017 [employer] Hypertension Hypertension, commonly called high blood pressure, is when the force of blood pumping through the arteries is too strong. The arteries are the blood vessels that carry blood from the heart throughout the body. Hypertension forces the heart to work harder to pump blood and may cause arteries to become narrow or stiff. Having untreated or uncontrolled hypertension can cause heart attacks, strokes, kidney disease, and other problems. A blood pressure reading consists of a higher number over a lower number. Ideally, your blood pressure should be below 120/80. The first ("top") number is called the  systolic pressure. It is a measure of the pressure in your arteries as your heart beats. The second ("bottom") number is called the diastolic pressure. It is a measure of the pressure in your arteries as the heart relaxes. What are the causes? The cause of this condition is not known. What increases the risk? Some risk  factors for high blood pressure are under your control. Others are not. Factors you can change   Smoking.  Having type 2 diabetes mellitus, high cholesterol, or both.  Not getting enough exercise or physical activity.  Being overweight.  Having too much fat, sugar, calories, or salt (sodium) in your diet.  Drinking too much alcohol. Factors that are difficult or impossible to change   Having chronic kidney disease.  Having a family history of high blood pressure.  Age. Risk increases with age.  Race. You may be at higher risk if you are African-American.  Gender. Men are at higher risk than women before age 71. After age 77, women are at higher risk than men.  Having obstructive sleep apnea.  Stress. What are the signs or symptoms? Extremely high blood pressure (hypertensive crisis) may cause:  Headache.  Anxiety.  Shortness of breath.  Nosebleed.  Nausea and vomiting.  Severe chest pain.  Jerky movements you cannot control (seizures). How is this diagnosed? This condition is diagnosed by measuring your blood pressure while you are seated, with your arm resting on a surface. The cuff of the blood pressure monitor will be placed directly against the skin of your upper arm at the level of your heart. It should be measured at least twice using the same arm. Certain conditions can cause a difference in blood pressure between your right and left arms. Certain factors can cause blood pressure readings to be lower or higher than normal (elevated) for a short period of time:  When your blood pressure is higher when you are in a health care provider's office than when you are at home, this is called white coat hypertension. Most people with this condition do not need medicines.  When your blood pressure is higher at home than when you are in a health care provider's office, this is called masked hypertension. Most people with this condition may need medicines to control  blood pressure. If you have a high blood pressure reading during one visit or you have normal blood pressure with other risk factors:  You may be asked to return on a different day to have your blood pressure checked again.  You may be asked to monitor your blood pressure at home for 1 week or longer. If you are diagnosed with hypertension, you may have other blood or imaging tests to help your health care provider understand your overall risk for other conditions. How is this treated? This condition is treated by making healthy lifestyle changes, such as eating healthy foods, exercising more, and reducing your alcohol intake. Your health care provider may prescribe medicine if lifestyle changes are not enough to get your blood pressure under control, and if:  Your systolic blood pressure is above 130.  Your diastolic blood pressure is above 80. Your personal target blood pressure may vary depending on your medical conditions, your age, and other factors. Follow these instructions at home: Eating and drinking   Eat a diet that is high in fiber and potassium, and low in sodium, added sugar, and fat. An example eating plan is called the DASH (Dietary Approaches to Stop Hypertension) diet. To  eat this way:  Eat plenty of fresh fruits and vegetables. Try to fill half of your plate at each meal with fruits and vegetables.  Eat whole grains, such as whole wheat pasta, brown rice, or whole grain bread. Fill about one quarter of your plate with whole grains.  Eat or drink low-fat dairy products, such as skim milk or low-fat yogurt.  Avoid fatty cuts of meat, processed or cured meats, and poultry with skin. Fill about one quarter of your plate with lean proteins, such as fish, chicken without skin, beans, eggs, and tofu.  Avoid premade and processed foods. These tend to be higher in sodium, added sugar, and fat.  Reduce your daily sodium intake. Most people with hypertension should eat less than  1,500 mg of sodium a day.  Limit alcohol intake to no more than 1 drink a day for nonpregnant women and 2 drinks a day for men. One drink equals 12 oz of beer, 5 oz of wine, or 1 oz of hard liquor. Lifestyle   Work with your health care provider to maintain a healthy body weight or to lose weight. Ask what an ideal weight is for you.  Get at least 30 minutes of exercise that causes your heart to beat faster (aerobic exercise) most days of the week. Activities may include walking, swimming, or biking.  Include exercise to strengthen your muscles (resistance exercise), such as pilates or lifting weights, as part of your weekly exercise routine. Try to do these types of exercises for 30 minutes at least 3 days a week.  Do not use any products that contain nicotine or tobacco, such as cigarettes and e-cigarettes. If you need help quitting, ask your health care provider.  Monitor your blood pressure at home as told by your health care provider.  Keep all follow-up visits as told by your health care provider. This is important. Medicines   Take over-the-counter and prescription medicines only as told by your health care provider. Follow directions carefully. Blood pressure medicines must be taken as prescribed.  Do not skip doses of blood pressure medicine. Doing this puts you at risk for problems and can make the medicine less effective.  Ask your health care provider about side effects or reactions to medicines that you should watch for. Contact a health care provider if:  You think you are having a reaction to a medicine you are taking.  You have headaches that keep coming back (recurring).  You feel dizzy.  You have swelling in your ankles.  You have trouble with your vision. Get help right away if:  You develop a severe headache or confusion.  You have unusual weakness or numbness.  You feel faint.  You have severe pain in your chest or abdomen.  You vomit  repeatedly.  You have trouble breathing. Summary  Hypertension is when the force of blood pumping through your arteries is too strong. If this condition is not controlled, it may put you at risk for serious complications.  Your personal target blood pressure may vary depending on your medical conditions, your age, and other factors. For most people, a normal blood pressure is less than 120/80.  Hypertension is treated with lifestyle changes, medicines, or a combination of both. Lifestyle changes include weight loss, eating a healthy, low-sodium diet, exercising more, and limiting alcohol. This information is not intended to replace advice given to you by your health care provider. Make sure you discuss any questions you have with your health care  provider. Document Released: 11/16/2005 Document Revised: 10/14/2016 Document Reviewed: 10/14/2016 Elsevier Interactive Patient Education  2017 [employer].

## 2017-03-05 NOTE — Progress Notes (Signed)
Subjective:  Patient ID: Kerry Parker, female    DOB: Dec 04, 1975  Age: 41 y.o. MRN: [id number]  CC: back pain   HPI Kerry Parker is a 41 y.o. female with a PMH of HTN presents for f/u of back pain. Went to the ED on 02/19/17 and diagnosed with cervical radiculopathy and muscle strain. Has been taking Naproxen and Diazepam which have been helpful. Pain is now felt at the left shoulder and left scapular pain. Pain worse when driving and with left rotation of neck. Better when using IcyHot in large amounts.     She would like to address anxiety and depression. Two years ago husband suffered seizures and a stroke in front of her. Husband also suffered an aortic rupture. Describes seeing her husband "losing his life". She has been emotionally and financially burdened. Husband is receiving disability but his check is connected to how much pt makes. The more money pt makes, the less the husband receives. Has children and has been dealing with bullying issues that eventually led to legal and court battles. Patient puts on a "strong" appearance in front of her family but she is feeling overwhelmed. Denies suicidal/homicidal ideation or intent.          Review of Systems  Constitutional: Negative for chills, fever and malaise/fatigue.  Eyes: Negative for blurred vision.  Respiratory: Negative for shortness of breath.   Cardiovascular: Negative for chest pain and palpitations.  Gastrointestinal: Negative for abdominal pain and nausea.  Genitourinary: Negative for dysuria and hematuria.  Musculoskeletal: Positive for back pain and joint pain. Negative for myalgias.  Skin: Negative for rash.  Neurological: Negative for tingling and headaches.  Psychiatric/Behavioral: Positive for depression. The patient is nervous/anxious.     Objective:  BP (!) 155/108 (BP Location: Right Arm, Patient Position: Sitting, Cuff Size: Large)   Pulse 74   Temp 98.1 F (36.7 C) (Oral)   Ht  (1.549 m)   Wt  181 lb 12.8 oz (82.5 kg)   LMP 02/15/2017 (Exact Date)   SpO2 99%   BMI 34.35 kg/m   BP/Weight 03/05/2017 02/19/2017 03/16/2016  Systolic BP 155 139 169  Diastolic BP 108 108 112  Wt. (Lbs) 181.8 187 -  BMI 34.35 35.33 -      Physical Exam  Constitutional: She is oriented to person, place, and time.  Well developed, overweight, NAD, polite  HENT:  Head: Normocephalic and atraumatic.  Eyes: No scleral icterus.  Cardiovascular: Normal rate, regular rhythm and normal heart sounds.   Pulmonary/Chest: Effort normal and breath sounds normal.  Musculoskeletal: She exhibits no edema.  Mild to moderate TTP of the left upper trapezius and along the medial border of left scapula. Nearly full aROM, limited 2/2 pain.  Neurological: She is alert and oriented to person, place, and time.  Skin: Skin is warm and dry. No rash noted. No erythema. No pallor.  Psychiatric: Her behavior is normal. Thought content normal.  Somewhat depressed mood. Tearful at times during interview.  Vitals reviewed.    Assessment & Plan:   1. Depression with anxiety - Ambulatory referral to Psychology - sertraline (ZOLOFT) 50 MG tablet; Take 1 tablet (50 mg total) by mouth daily.  Dispense: 30 tablet; Refill: 3 - acetaminophen-codeine (TYLENOL #3) 300-30 MG tablet; Take 1 tablet by mouth every 4 (four) hours as needed for moderate pain.  Dispense: 42 tablet; Refill: 0  2. HYPERTENSION, BENIGN ESSENTIAL - lisinopril-hydrochlorothiazide (PRINZIDE,ZESTORETIC) 10-12.5 MG tablet; Take 1 tablet by mouth daily.  Dispense: 90 tablet; Refill: 3 - TSH - Comprehensive metabolic panel  3. Back strain, initial encounter - cyclobenzaprine (FLEXERIL) 10 MG tablet; Take 1 tablet (10 mg total) by mouth 3 (three) times daily as needed for muscle spasms.  Dispense: 30 tablet; Refill: 0 - Ambulatory referral to Physical Therapy   Meds ordered this encounter  Medications  . sertraline (ZOLOFT) 50 MG tablet    Sig: Take 1 tablet  (50 mg total) by mouth daily.    Dispense:  30 tablet    Refill:  3    Order Specific Question:   Supervising Provider    Answer:   Quentin Angst [id number]  . lisinopril-hydrochlorothiazide (PRINZIDE,ZESTORETIC) 10-12.5 MG tablet    Sig: Take 1 tablet by mouth daily.    Dispense:  90 tablet    Refill:  3    Order Specific Question:   Supervising Provider    Answer:   Quentin Angst [id number]  . cyclobenzaprine (FLEXERIL) 10 MG tablet    Sig: Take 1 tablet (10 mg total) by mouth 3 (three) times daily as needed for muscle spasms.    Dispense:  30 tablet    Refill:  0    Order Specific Question:   Supervising Provider    Answer:   Quentin Angst [id number]  . acetaminophen-codeine (TYLENOL #3) 300-30 MG tablet    Sig: Take 1 tablet by mouth every 4 (four) hours as needed for moderate pain.    Dispense:  42 tablet    Refill:  0    Order Specific Question:   Supervising Provider    Answer:   Quentin Angst [id number]    Follow-up: Return in about 4 weeks (around 04/02/2017) for f/u HTN and depression.   Loletta Specter PA

## 2017-03-05 NOTE — Progress Notes (Signed)
Pt presents as a new patient for a HFU Pt complains of pain today= 8 Pt has pain from neck on left side that radiates down arm causing numbness in fingertips

## 2017-03-06 LAB — COMPREHENSIVE METABOLIC PANEL
A/G RATIO: 1.3 (ref 1.2–2.2)
ALK PHOS: 54 IU/L (ref 39–117)
ALT: 6 IU/L (ref 0–32)
AST: 12 IU/L (ref 0–40)
Albumin: 4.5 g/dL (ref 3.5–5.5)
BILIRUBIN TOTAL: 0.4 mg/dL (ref 0.0–1.2)
BUN/Creatinine Ratio: 9 (ref 9–23)
BUN: 5 mg/dL — ABNORMAL LOW (ref 6–20)
CHLORIDE: 101 mmol/L (ref 96–106)
CO2: 22 mmol/L (ref 18–29)
Calcium: 9.1 mg/dL (ref 8.7–10.2)
Creatinine, Ser: 0.56 mg/dL — ABNORMAL LOW (ref 0.57–1.00)
GFR calc Af Amer: 136 mL/min/{1.73_m2} (ref >59)
GFR calc non Af Amer: 118 mL/min/{1.73_m2} (ref >59)
Globulin, Total: 3.4 g/dL (ref 1.5–4.5)
Glucose: 93 mg/dL (ref 65–99)
POTASSIUM: 3.7 mmol/L (ref 3.5–5.2)
Sodium: 140 mmol/L (ref 134–144)
Total Protein: 7.9 g/dL (ref 6.0–8.5)

## 2017-03-06 LAB — TSH: TSH: 0.728 u[IU]/mL (ref 0.450–4.500)

## 2017-03-15 ENCOUNTER — Ambulatory Visit: Payer: No Typology Code available for payment source | Attending: Physician Assistant | Admitting: Physical Therapy

## 2017-04-07 ENCOUNTER — Encounter (INDEPENDENT_AMBULATORY_CARE_PROVIDER_SITE_OTHER): Payer: Self-pay | Admitting: Physician Assistant

## 2017-04-07 ENCOUNTER — Ambulatory Visit (INDEPENDENT_AMBULATORY_CARE_PROVIDER_SITE_OTHER): Payer: No Typology Code available for payment source | Admitting: Physician Assistant

## 2017-04-07 VITALS — BP 128/90 | HR 98 | Temp 97.8°F | Wt 177.0 lb

## 2017-04-07 DIAGNOSIS — M542 Cervicalgia: Secondary | ICD-10-CM | POA: Diagnosis not present

## 2017-04-07 DIAGNOSIS — M5412 Radiculopathy, cervical region: Secondary | ICD-10-CM | POA: Diagnosis not present

## 2017-04-07 MED ORDER — ACETAMINOPHEN-CODEINE #3 300-30 MG PO TABS: 1.0000 | ORAL_TABLET | Freq: Two times a day (BID) | ORAL | 0 refills | Status: DC

## 2017-04-07 MED ORDER — GABAPENTIN 300 MG PO CAPS: 300.0000 mg | ORAL_CAPSULE | Freq: Three times a day (TID) | ORAL | 3 refills | Status: AC

## 2017-04-07 MED ORDER — NAPROXEN 500 MG PO TABS: 500.0000 mg | ORAL_TABLET | Freq: Two times a day (BID) | ORAL | 0 refills | Status: DC

## 2017-04-07 NOTE — Patient Instructions (Signed)
Cervical Radiculopathy Cervical radiculopathy happens when a nerve in the neck (cervical nerve) is pinched or bruised. This condition can develop because of an injury or as part of the normal aging process. Pressure on the cervical nerves can cause pain or numbness that runs from the neck all the way down into the arm and fingers. Usually, this condition gets better with rest. Treatment may be needed if the condition does not improve. What are the causes? This condition may be caused by:  Injury.  Slipped (herniated) disk.  Muscle tightness in the neck because of overuse.  Arthritis.  Breakdown or degeneration in the bones and joints of the spine (spondylosis) due to aging.  Bone spurs that may develop near the cervical nerves. What are the signs or symptoms? Symptoms of this condition include:  Pain that runs from the neck to the arm and hand. The pain can be severe or irritating. It may be worse when the neck is moved.  Numbness or weakness in the affected arm and hand. How is this diagnosed? This condition may be diagnosed based on symptoms, medical history, and a physical exam. You may also have tests, including:  X-rays.  CT scan.  MRI.  Electromyogram (EMG).  Nerve conduction tests. How is this treated? In many cases, treatment is not needed for this condition. With rest, the condition usually gets better over time. If treatment is needed, options may include:  Wearing a soft neck collar for short periods of time.  Physical therapy to strengthen your neck muscles.  Medicines, such as NSAIDs, oral corticosteroids, or spinal injections.  Surgery. This may be needed if other treatments do not help. Various types of surgery may be done depending on the cause of your problems. Follow these instructions at home: Managing pain   Take over-the-counter and prescription medicines only as told by your health care provider.  If directed, apply ice to the affected  area.  Put ice in a plastic bag.  Place a towel between your skin and the bag.  Leave the ice on for 20 minutes, 2-3 times per day.  If ice does not help, you can try using heat. Take a warm shower or warm bath, or use a heat pack as told by your health care provider.  Try a gentle neck and shoulder massage to help relieve symptoms. Activity   Rest as needed. Follow instructions from your health care provider about any restrictions on activities.  Do stretching and strengthening exercises as told by your health care provider or physical therapist. General instructions   If you were given a soft collar, wear it as told by your health care provider.  Use a flat pillow when you sleep.  Keep all follow-up visits as told by your health care provider. This is important. Contact a health care provider if:  Your condition does not improve with treatment. Get help right away if:  Your pain gets much worse and cannot be controlled with medicines.  You have weakness or numbness in your hand, arm, face, or leg.  You have a high fever.  You have a stiff, rigid neck.  You lose control of your bowels or your bladder (have incontinence).  You have trouble with walking, balance, or speaking. This information is not intended to replace advice given to you by your health care provider. Make sure you discuss any questions you have with your health care provider. Document Released: 08/11/2001 Document Revised: 04/23/2016 Document Reviewed: 01/10/2015 Elsevier Interactive Patient Education    2017 [employer] Inc[employer]  

## 2017-04-07 NOTE — Progress Notes (Signed)
Subjective:  Patient ID: Kerry Parker, female    DOB: 1976-09-01  Age: 41 y.o. MRN: [id number]  CC: F/U depression, HTN, and neck pain  HPI Kerry SheldonDiesha Y Lookingbill is a 41 y.o. female with a PMH of HTN returns to follow up on HTN and neck pain. HTN is better with Prinzide 10-12.5 mg. Depression is better with Zoloft 50mg . Feels that she has more resiliency against stress. Main complaint is left sided neck pain. She has taken cyclobenzaprine and Tylenol #3 with some temporary relief of pain. However, baseline pain has remained the same. Pain originates at approximately C3-4 and radiates through the left upper trapezius and terminates just below the left deltoids. She was referred to Pt could not go to Physical Therapy because the insurance would not cover her sessions. Pt told that she would have to be in an accident before physical therapy is covered. Has felt burning in the left upper trapezius. Tingling in the left fingers "about twice per week". Feels that she can not hold heavier items as long as she did before. Does not endorse any other symptoms.      Outpatient Medications Prior to Visit  Medication Sig Dispense Refill  . lisinopril-hydrochlorothiazide (PRINZIDE,ZESTORETIC) 10-12.5 MG tablet Take 1 tablet by mouth daily. 90 tablet 3  . sertraline (ZOLOFT) 50 MG tablet Take 1 tablet (50 mg total) by mouth daily. 30 tablet 3  . cyclobenzaprine (FLEXERIL) 10 MG tablet Take 1 tablet (10 mg total) by mouth 3 (three) times daily as needed for muscle spasms. 30 tablet 0  . naproxen (NAPROSYN) 500 MG tablet Take 1 tablet (500 mg total) by mouth 2 (two) times daily. 30 tablet 0  . Olive Oil (SWEET OIL) OIL 5 drops by Does not apply route 3 (three) times daily as needed (pain).     No facility-administered medications prior to visit.      ROS Review of Systems  Constitutional: Negative for chills, fever and malaise/fatigue.  Eyes: Negative for blurred vision.  Respiratory: Negative for shortness  of breath.   Cardiovascular: Negative for chest pain and palpitations.  Gastrointestinal: Negative for abdominal pain and nausea.  Genitourinary: Negative for dysuria and hematuria.  Musculoskeletal: Negative for joint pain and myalgias.  Skin: Negative for rash.  Neurological: Negative for tingling and headaches.  Psychiatric/Behavioral: Negative for depression. The patient is not nervous/anxious.     Objective:  BP 128/90 (BP Location: Right Arm, Patient Position: Sitting, Cuff Size: Normal)   Pulse 98   Temp 97.8 F (36.6 C) (Oral)   Wt 177 lb (80.3 kg)   LMP 04/03/2017 (Exact Date)   SpO2 98%   BMI 33.44 kg/m   BP/Weight 04/07/2017 03/05/2017 02/19/2017  Systolic BP 128 155 139  Diastolic BP 90 108 108  Wt. (Lbs) 177 181.8 187  BMI 33.44 34.35 35.33      Physical Exam  Constitutional: She is oriented to person, place, and time.  Well developed, obese, NAD, polite  HENT:  Head: Normocephalic and atraumatic.  Eyes: No scleral icterus.  Cardiovascular: Normal rate, regular rhythm and normal heart sounds.   Pulmonary/Chest: Effort normal and breath sounds normal.  Musculoskeletal: She exhibits no edema or deformity.  Neck with full aROM. Axial load testing positive on left. Mild tenderness to palpation on insertion of left upper trapezius at occiput. Patellar DTR 2+ bilaterally  Neurological: She is alert and oriented to person, place, and time. No cranial nerve deficit. Coordination normal.  LUE with 4/5 strength throughout. Left  Biceps reflex 1+.   Skin: Skin is warm and dry. No rash noted. No erythema. No pallor.  Psychiatric: She has a normal mood and affect. Her behavior is normal. Thought content normal.  Vitals reviewed.    Assessment & Plan:   1. Cervicalgia - DG Cervical Spine Complete; Future - Refill naproxen (NAPROSYN) 500 MG tablet; Take 1 tablet (500 mg total) by mouth 2 (two) times daily.  Dispense: 30 tablet; Refill: 0 - Refill acetaminophen-codeine  (TYLENOL #3) 300-30 MG tablet; Take 1 tablet by mouth 2 (two) times daily.  Dispense: 60 tablet; Refill: 0 - AMB referral to orthopedics  2. Cervical radiculopathy - DG Cervical Spine Complete; Future - Begin gabapentin (NEURONTIN) 300 MG capsule; Take 1 capsule (300 mg total) by mouth 3 (three) times daily.  Dispense: 90 capsule; Refill: 3 - AMB referral to orthopedics   Meds ordered this encounter  Medications  . gabapentin (NEURONTIN) 300 MG capsule    Sig: Take 1 capsule (300 mg total) by mouth 3 (three) times daily.    Dispense:  90 capsule    Refill:  3    Order Specific Question:   Supervising Provider    Answer:   Quentin Angst [id number]  . naproxen (NAPROSYN) 500 MG tablet    Sig: Take 1 tablet (500 mg total) by mouth 2 (two) times daily.    Dispense:  30 tablet    Refill:  0    Order Specific Question:   Supervising Provider    Answer:   Quentin Angst [id number]  . acetaminophen-codeine (TYLENOL #3) 300-30 MG tablet    Sig: Take 1 tablet by mouth 2 (two) times daily.    Dispense:  60 tablet    Refill:  0    Order Specific Question:   Supervising Provider    Answer:   Quentin Angst [id number]    Follow-up: Return in about 4 weeks (around 05/05/2017) for cervicalgia.   Loletta Specter PA

## 2017-05-05 ENCOUNTER — Ambulatory Visit (INDEPENDENT_AMBULATORY_CARE_PROVIDER_SITE_OTHER): Payer: PRIVATE HEALTH INSURANCE | Admitting: Physician Assistant

## 2017-06-28 ENCOUNTER — Ambulatory Visit (INDEPENDENT_AMBULATORY_CARE_PROVIDER_SITE_OTHER): Payer: PRIVATE HEALTH INSURANCE | Admitting: Physician Assistant

## 2017-06-28 ENCOUNTER — Ambulatory Visit (INDEPENDENT_AMBULATORY_CARE_PROVIDER_SITE_OTHER): Payer: No Typology Code available for payment source | Admitting: Internal Medicine

## 2017-06-28 ENCOUNTER — Encounter (INDEPENDENT_AMBULATORY_CARE_PROVIDER_SITE_OTHER): Payer: Self-pay | Admitting: Internal Medicine

## 2017-06-28 VITALS — BP 127/88 | HR 84 | Temp 98.0°F | Wt 180.8 lb

## 2017-06-28 DIAGNOSIS — S39012A Strain of muscle, fascia and tendon of lower back, initial encounter: Secondary | ICD-10-CM

## 2017-06-28 DIAGNOSIS — I10 Essential (primary) hypertension: Secondary | ICD-10-CM

## 2017-06-28 DIAGNOSIS — M542 Cervicalgia: Secondary | ICD-10-CM

## 2017-06-28 DIAGNOSIS — R21 Rash and other nonspecific skin eruption: Secondary | ICD-10-CM | POA: Diagnosis not present

## 2017-06-28 MED ORDER — ACETAMINOPHEN-CODEINE #3 300-30 MG PO TABS: 1.0000 | ORAL_TABLET | Freq: Two times a day (BID) | ORAL | 0 refills | Status: DC

## 2017-06-28 MED ORDER — NYSTATIN-TRIAMCINOLONE 100000-0.1 UNIT/GM-% EX OINT: 1.0000 "application " | TOPICAL_OINTMENT | Freq: Two times a day (BID) | CUTANEOUS | 1 refills | Status: DC

## 2017-06-28 NOTE — Patient Instructions (Signed)
Rash A rash is a change in the color of the skin. A rash can also change the way your skin feels. There are many different conditions and factors that can cause a rash. Follow these instructions at home: Pay attention to any changes in your symptoms. Follow these instructions to help with your condition: Medicine Take or apply over-the-counter and prescription medicines only as told by your health care provider. These may include:  Corticosteroid cream.  Anti-itch lotions.  Oral antihistamines.  Skin Care  Apply cool compresses to the affected areas.  Try taking a bath with: ? Epsom salts. Follow the instructions on the packaging. You can get these at your local pharmacy or grocery store. ? Baking soda. Pour a small amount into the bath as told by your health care provider. ? Colloidal oatmeal. Follow the instructions on the packaging. You can get this at your local pharmacy or grocery store.  Try applying baking soda paste to your skin. Stir water into baking soda until it reaches a paste-like consistency.  Do not scratch or rub your skin.  Avoid covering the rash. Make sure the rash is exposed to air as much as possible. General instructions  Avoid hot showers or baths, which can make itching worse. A cold shower may help.  Avoid scented soaps, detergents, and perfumes. Use gentle soaps, detergents, perfumes, and other cosmetic products.  Avoid any substance that causes your rash. Keep a journal to help track what causes your rash. Write down: ? What you eat. ? What cosmetic products you use. ? What you drink. ? What you wear. This includes jewelry.  Keep all follow-up visits as told by your health care provider. This is important. Contact a health care provider if:  You sweat at night.  You lose weight.  You urinate more than normal.  You feel weak.  You vomit.  Your skin or the whites of your eyes look yellow (jaundice).  Your skin: ? Tingles. ? Is  numb.  Your rash: ? Does not go away after several days. ? Gets worse.  You are: ? Unusually thirsty. ? More tired than normal.  You have: ? New symptoms. ? Pain in your abdomen. ? A fever. ? Diarrhea. Get help right away if:  You develop a rash that covers all or most of your body. The rash may or may not be painful.  You develop blisters that: ? Are on top of the rash. ? Grow larger or grow together. ? Are painful. ? Are inside your nose or mouth.  You develop a rash that: ? Looks like purple pinprick-sized spots all over your body. ? Has a "bull's eye" or looks like a target. ? Is not related to sun exposure, is red and painful, and causes your skin to peel. This information is not intended to replace advice given to you by your health care provider. Make sure you discuss any questions you have with your health care provider. Document Released: 11/06/2002 Document Revised: 04/21/2016 Document Reviewed: 04/03/2015 Elsevier Interactive Patient Education  2017 Elsevier Inc.  

## 2017-06-28 NOTE — Progress Notes (Signed)
Kerry Parker Zeoli, is a 41 y.o. female  AVW:[id number]  [id number]  DOB - [birth year]  Chief Complaint  Patient presents with  . Rash  . Pain    all down left side        Subjective:   Kerry Parker Harsha is a 41 y.o. female with history of HTN presents here today for a sick visit. She is complaining of significant itching and rash around the upper thigh and perineum for about a week now. No known allergies, no change in cosmetics or Lotion. BP is controlled. Depression is better. She is sexually active with only one partner. She denies any suicidal ideation or thoughts. She has no vaginal discharge, no urinary symptom, no change in bowel habits. Patient has No headache, No chest pain, No abdominal pain - No Nausea, No new weakness tingling or numbness, No Cough - SOB.  Problem  Perineal Rash in Female  Strain of Back    ALLERGIES: No Known Allergies  PAST MEDICAL HISTORY: Past Medical History:  Diagnosis Date  . Hypertension     MEDICATIONS AT HOME: Prior to Admission medications   Medication Sig Start Date End Date Taking? Authorizing Provider  gabapentin (NEURONTIN) 300 MG capsule Take 1 capsule (300 mg total) by mouth 3 (three) times daily. 04/07/17  Yes Loletta SpecterGomez, Roger David, PA-C  lisinopril-hydrochlorothiazide (PRINZIDE,ZESTORETIC) 10-12.5 MG tablet Take 1 tablet by mouth daily. 03/05/17  Yes Loletta SpecterGomez, Roger David, PA-C  naproxen (NAPROSYN) 500 MG tablet Take 1 tablet (500 mg total) by mouth 2 (two) times daily. 04/07/17  Yes Loletta SpecterGomez, Roger David, PA-C  acetaminophen-codeine (TYLENOL #3) 300-30 MG tablet Take 1 tablet by mouth 2 (two) times daily. 06/28/17   Quentin AngstJegede, Trust Leh E, MD  nystatin-triamcinolone ointment (MYCOLOG) Apply 1 application topically 2 (two) times daily. 06/28/17   Quentin AngstJegede, Cinda Hara E, MD  sertraline (ZOLOFT) 50 MG tablet Take 1 tablet (50 mg total) by mouth daily. Patient not taking: Reported on 06/28/2017 03/05/17   Loletta SpecterGomez, Roger David, PA-C    Objective:    Vitals:   06/28/17 1432  BP: 127/88  Pulse: 84  Temp: 98 F (36.7 C)  TempSrc: Oral  SpO2: 96%  Weight: 180 lb 12.8 oz (82 kg)   Exam General appearance : Awake, alert, not in any distress. Speech Clear. Not toxic looking, obese HEENT: Atraumatic and Normocephalic, pupils equally reactive to light and accomodation Neck: Supple, no JVD. No cervical lymphadenopathy.  Chest: Good air entry bilaterally, no added sounds  CVS: S1 S2 regular, no murmurs.  Abdomen: Bowel sounds present, Non tender and not distended with no gaurding, rigidity or rebound. Extremities: B/L Lower Ext shows no edema, both legs are warm to touch Neurology: Awake alert, and oriented X 3, CN II-XII intact, Non focal Skin: perineal fungal rash with scratch marks  Data Review No results found for: HGBA1C  Assessment & Plan   1. HYPERTENSION, BENIGN ESSENTIAL  We have discussed target BP range and blood pressure goal. I have advised patient to check BP regularly and to call us back or report to clinic if the numbers are consistently higher than 140/90. We discussed the importance of compliance with medical therapy and DASH diet recommended, consequences of uncontrolled hypertension discussed.  - continue current BP medications  2. Perineal rash in female  - nystatin-triamcinolone ointment (MYCOLOG); Apply 1 application topically 2 (two) times daily.  Dispense: 30 g; Refill: 1  3. Back strain, initial encounter  - acetaminophen-codeine (TYLENOL #3) 300-30 MG tablet; Take 1 tablet by  mouth 2 (two) times daily.  Dispense: 20 tablet; Refill: 0  Patient have been counseled extensively about nutrition and exercise. Other issues discussed during this visit include: low cholesterol diet, weight control and daily exercise, importance of adherence with medications and regular follow-up. We also discussed long term complications of uncontrolled hypertension.   Return in about 3 months (around 09/28/2017) for Follow up  HTN.  The patient was given clear instructions to go to ER or return to medical center if symptoms don't improve, worsen or new problems develop. The patient verbalized understanding. The patient was told to call to get lab results if they haven't heard anything in the next week.   This note has been created with Education officer, environmentalDragon speech recognition software and smart phrase technology. Any transcriptional errors are unintentional.    Jeanann LewandowskyJEGEDE, Brecken Dewoody, MD, MHA, FACP, FAAP, CPE [facility] [place], [place] [phone]   06/28/2017, 3:23 PM

## 2017-06-29 ENCOUNTER — Other Ambulatory Visit (INDEPENDENT_AMBULATORY_CARE_PROVIDER_SITE_OTHER): Payer: Self-pay | Admitting: Physician Assistant

## 2017-06-29 ENCOUNTER — Telehealth (INDEPENDENT_AMBULATORY_CARE_PROVIDER_SITE_OTHER): Payer: Self-pay | Admitting: Physician Assistant

## 2017-06-29 DIAGNOSIS — R21 Rash and other nonspecific skin eruption: Secondary | ICD-10-CM

## 2017-06-29 MED ORDER — CLOTRIMAZOLE 1 % EX CREA: 1.0000 "application " | TOPICAL_CREAM | Freq: Two times a day (BID) | CUTANEOUS | 0 refills | Status: DC

## 2017-06-29 NOTE — Telephone Encounter (Signed)
FWD to PCP. Kerry Parker S Britnie Colville, CMA  

## 2017-06-29 NOTE — Telephone Encounter (Signed)
I have changed to clotrimazole 1% cream to be used twice a day.

## 2017-06-29 NOTE — Telephone Encounter (Signed)
Patient called stated rx nystatin-triamcinolone ointment  given yesterday is over 100.00 dollars can not afford it. Wants another Rx sent [facility] [address]   Please follow up with patient

## 2017-07-01 NOTE — Telephone Encounter (Signed)
Pt was informed that the PCP has sent a new prescription to the pharmacy

## 2017-09-20 ENCOUNTER — Ambulatory Visit (INDEPENDENT_AMBULATORY_CARE_PROVIDER_SITE_OTHER): Payer: PRIVATE HEALTH INSURANCE | Admitting: Physician Assistant

## 2017-09-23 ENCOUNTER — Ambulatory Visit (INDEPENDENT_AMBULATORY_CARE_PROVIDER_SITE_OTHER): Payer: No Typology Code available for payment source | Admitting: Physician Assistant

## 2017-09-30 ENCOUNTER — Ambulatory Visit (INDEPENDENT_AMBULATORY_CARE_PROVIDER_SITE_OTHER): Payer: No Typology Code available for payment source | Admitting: Physician Assistant

## 2018-02-10 ENCOUNTER — Ambulatory Visit (HOSPITAL_COMMUNITY)
Admission: EM | Admit: 2018-02-10 | Discharge: 2018-02-10 | Disposition: A | Discharge status: Home / Self Care | Payer: No Typology Code available for payment source | Attending: Internal Medicine | Admitting: Internal Medicine

## 2018-02-10 ENCOUNTER — Encounter (HOSPITAL_COMMUNITY): Payer: Self-pay | Admitting: Emergency Medicine

## 2018-02-10 ENCOUNTER — Emergency Department (HOSPITAL_COMMUNITY)
Admission: EM | Admit: 2018-02-10 | Discharge: 2018-02-10 | Discharge status: Absent without leave | Payer: No Typology Code available for payment source

## 2018-02-10 DIAGNOSIS — R197 Diarrhea, unspecified: Secondary | ICD-10-CM

## 2018-02-10 DIAGNOSIS — R112 Nausea with vomiting, unspecified: Secondary | ICD-10-CM

## 2018-02-10 MED ORDER — ONDANSETRON 4 MG PO TBDP
4.0000 mg | ORAL_TABLET | Freq: Once | ORAL | Status: AC
  Administered 2018-02-10: 4 mg via ORAL

## 2018-02-10 MED ORDER — ONDANSETRON HCL 4 MG/2ML IJ SOLN
INTRAMUSCULAR | Status: AC
  Filled 2018-02-10: qty 2

## 2018-02-10 MED ORDER — ONDANSETRON HCL 4 MG/2ML IJ SOLN
4.0000 mg | Freq: Once | INTRAMUSCULAR | Status: AC
  Administered 2018-02-10: 4 mg via INTRAVENOUS

## 2018-02-10 MED ORDER — SODIUM CHLORIDE 0.9 % IV BOLUS (SEPSIS)
1000.0000 mL | Freq: Once | INTRAVENOUS | Status: AC
Start: 2018-02-10 — End: 2018-02-10
  Administered 2018-02-10: 1000 mL via INTRAVENOUS

## 2018-02-10 MED ORDER — ONDANSETRON 4 MG PO TBDP
ORAL_TABLET | ORAL | Status: AC
  Filled 2018-02-10: qty 1

## 2018-02-10 MED ORDER — ONDANSETRON 4 MG PO TBDP: 4.0000 mg | ORAL_TABLET | Freq: Three times a day (TID) | ORAL | 0 refills | Status: AC | PRN

## 2018-02-10 MED ORDER — ONDANSETRON HCL 4 MG/2ML IJ SOLN: 4.0000 mg | Freq: Once | INTRAMUSCULAR | Status: DC

## 2018-02-10 NOTE — Discharge Instructions (Signed)
Zofran for nausea and vomiting as needed. Keep hydrated, you urine should be clear to pale yellow in color. Bland diet as attached, advance as tolerated. Monitor for any worsening of symptoms, nausea or vomiting not controlled by medication, worsening abdominal pain, fever, more blood in vomit, black/tarry stools, blood in stool, go to the emergency department for further evaluation.

## 2018-02-10 NOTE — ED Provider Notes (Signed)
[facility]    CSN: [id number] Arrival date & time: 02/10/18  1725     History   Chief Complaint Chief Complaint  Patient presents with  . Emesis  . Diarrhea    HPI Kerry Parker is a 42 y.o. female.   42 year old female comes in with 1 day history of nausea, vomiting, diarrhea.  States since symptom onset she has had 3 episodes per hour of vomiting and diarrhea. States last episode of vomiting had strand of blood. Denies blood in stool. Denies history of alcohol abuse, or current alcohol use. Denies long term/excessive NSAID use. States abdominal pain when vomiting that has since resolved. She has also had rhinorrhea and nasal congestion since symptom onset. Current every day smoker, 5 cigs/day, 6-7 years. She has not tried anything for the symptoms. Denies recent antibiotic use.       Past Medical History:  Diagnosis Date  . Hypertension     Patient Active Problem List   Diagnosis Date Noted  . Perineal rash in female 06/28/2017  . Strain of back 06/28/2017  . Depression with anxiety 03/05/2017  . TOBACCO ABUSE 08/29/2008  . HYPERTENSION, BENIGN ESSENTIAL 08/29/2008  . MASTITIS 08/29/2008    Past Surgical History:  Procedure Laterality Date  . BACK SURGERY      OB History    No data available       Home Medications    Prior to Admission medications   Medication Sig Start Date End Date Taking? Authorizing Provider  clotrimazole (LOTRIMIN) 1 % cream Apply 1 application topically 2 (two) times daily. 06/29/17   Loletta SpecterGomez, Roger David, PA-C  gabapentin (NEURONTIN) 300 MG capsule Take 1 capsule (300 mg total) by mouth 3 (three) times daily. 04/07/17   Loletta SpecterGomez, Roger David, PA-C  lisinopril-hydrochlorothiazide (PRINZIDE,ZESTORETIC) 10-12.5 MG tablet Take 1 tablet by mouth daily. 03/05/17   Loletta SpecterGomez, Roger David, PA-C  naproxen (NAPROSYN) 500 MG tablet Take 1 tablet (500 mg total) by mouth 2 (two) times daily. 04/07/17   Loletta SpecterGomez, Roger David, PA-C    nystatin-triamcinolone ointment [facility](MYCOLOG) Apply 1 application topically 2 (two) times daily. 06/28/17   Quentin AngstJegede, Olugbemiga E, MD  ondansetron (ZOFRAN ODT) 4 MG disintegrating tablet Take 1 tablet (4 mg total) by mouth every 8 (eight) hours as needed for nausea or vomiting. 02/10/18   Cathie HoopsYu, Cherrish Vitali V, PA-C  sertraline (ZOLOFT) 50 MG tablet Take 1 tablet (50 mg total) by mouth daily. Patient not taking: Reported on 06/28/2017 03/05/17   Loletta SpecterGomez, Roger David, PA-C    Family History No family history on file.  Social History Social History   Tobacco Use  . Smoking status: Current Every Day Smoker    Types: Cigarettes  . Smokeless tobacco: Never Used  Substance Use Topics  . Alcohol use: No  . Drug use: Yes    Types: Marijuana     Allergies   Patient has no known allergies.   Review of Systems Review of Systems  Reason unable to perform ROS: See HPI as above.     Physical Exam Triage Vital Signs ED Triage Vitals [02/10/18 1847]  Enc Vitals Group     BP (!) 180/112     Pulse Rate 89     Resp 16     Temp 98.9 F (37.2 C)     Temp Source Oral     SpO2 100 %     Weight 180 lb (81.6 kg)     Height      Head  Circumference      Peak Flow      Pain Score 10     Pain Loc      Pain Edu?      Excl. in GC?    No data found.  Updated Vital Signs BP (!) 180/112   Pulse 89   Temp 98.9 F (37.2 C) (Oral)   Resp 16   Wt 180 lb (81.6 kg)   LMP 01/27/2018   SpO2 100%   BMI 34.01 kg/m   Physical Exam  Constitutional: She is oriented to person, place, and time. She appears well-developed and well-nourished. No distress.  HENT:  Head: Normocephalic and atraumatic.  Right Ear: Tympanic membrane, external ear and ear canal normal. Tympanic membrane is not erythematous and not bulging.  Left Ear: Tympanic membrane, external ear and ear canal normal. Tympanic membrane is not erythematous and not bulging.  Nose: Nose normal. Right sinus exhibits no maxillary sinus tenderness and no  frontal sinus tenderness. Left sinus exhibits no maxillary sinus tenderness and no frontal sinus tenderness.  Mouth/Throat: Uvula is midline, oropharynx is clear and moist and mucous membranes are normal.  Eyes: Conjunctivae are normal. Pupils are equal, round, and reactive to light.  Neck: Normal range of motion. Neck supple.  Cardiovascular: Normal rate, regular rhythm and normal heart sounds. Exam reveals no gallop and no friction rub.  No murmur heard. Pulmonary/Chest: Effort normal and breath sounds normal. She has no decreased breath sounds. She has no wheezes. She has no rhonchi. She has no rales.  Abdominal: Soft. Bowel sounds are normal. She exhibits no mass. There is no rebound, no guarding and no CVA tenderness.  Generalized abdominal tenderness without guarding or rebound  Lymphadenopathy:    She has no cervical adenopathy.  Neurological: She is alert and oriented to person, place, and time.  Skin: Skin is warm and dry.  Psychiatric: She has a normal mood and affect. Her behavior is normal. Judgment normal.     UC Treatments / Results  Labs (all labs ordered are listed, but only abnormal results are displayed) Labs Reviewed - No data to display  EKG  EKG Interpretation None       Radiology No results found.  Procedures Procedures (including critical care time)  Medications Ordered in UC Medications  ondansetron (ZOFRAN-ODT) disintegrating tablet 4 mg (4 mg Oral Given 02/10/18 1853)  sodium chloride 0.9 % bolus 1,000 mL (1,000 mLs Intravenous New Bag/Given 02/10/18 2009)  ondansetron (ZOFRAN) injection 4 mg (4 mg Intravenous Given 02/10/18 2010)     Initial Impression / Assessment and Plan / UC Course  I have reviewed the triage vital signs and the nursing notes.  Pertinent labs & imaging results that were available during my care of the patient were reviewed by me and considered in my medical decision making (see chart for details).    Patient was not able to  tolerate fluids after Zofran ODT.  Given continued diarrhea and vomiting, provided 1 L IV fluid bolus and IV Zofran. Zofran ODT prescribed as needed for nausea and vomiting.  To refrain from big cups of water and try small sips with ice cubes for now.  Strict return precautions given.  Patient expresses understanding and agrees to plan.  Final Clinical Impressions(s) / UC Diagnoses   Final diagnoses:  Nausea vomiting and diarrhea    ED Discharge Orders        Ordered    ondansetron (ZOFRAN ODT) 4 MG disintegrating tablet  Every 8  hours PRN     02/10/18 1943        Lurline Idol 02/10/18 2129

## 2018-02-10 NOTE — ED Triage Notes (Signed)
PT reports sudden onset vomiting and diarrhea at midnight last night. PT reports acid reflux as well. PT estimates vomiting 3x per hour since onset.

## 2018-04-06 ENCOUNTER — Other Ambulatory Visit (INDEPENDENT_AMBULATORY_CARE_PROVIDER_SITE_OTHER): Payer: Self-pay | Admitting: Physician Assistant

## 2018-04-06 DIAGNOSIS — F418 Other specified anxiety disorders: Secondary | ICD-10-CM

## 2019-10-29 ENCOUNTER — Emergency Department (HOSPITAL_COMMUNITY)
Admission: EM | Admit: 2019-10-29 | Discharge: 2019-10-29 | Disposition: A | Discharge status: Home / Self Care | Payer: Medicaid Other | Attending: Emergency Medicine | Admitting: Emergency Medicine

## 2019-10-29 ENCOUNTER — Encounter (HOSPITAL_COMMUNITY): Payer: Self-pay | Admitting: Emergency Medicine

## 2019-10-29 ENCOUNTER — Other Ambulatory Visit: Payer: Self-pay

## 2019-10-29 ENCOUNTER — Emergency Department (HOSPITAL_COMMUNITY): Payer: Medicaid Other

## 2019-10-29 DIAGNOSIS — Z79899 Other long term (current) drug therapy: Secondary | ICD-10-CM | POA: Insufficient documentation

## 2019-10-29 DIAGNOSIS — R1084 Generalized abdominal pain: Secondary | ICD-10-CM | POA: Diagnosis not present

## 2019-10-29 DIAGNOSIS — Z20828 Contact with and (suspected) exposure to other viral communicable diseases: Secondary | ICD-10-CM | POA: Insufficient documentation

## 2019-10-29 DIAGNOSIS — F17210 Nicotine dependence, cigarettes, uncomplicated: Secondary | ICD-10-CM | POA: Diagnosis not present

## 2019-10-29 DIAGNOSIS — I10 Essential (primary) hypertension: Secondary | ICD-10-CM | POA: Insufficient documentation

## 2019-10-29 DIAGNOSIS — R1114 Bilious vomiting: Secondary | ICD-10-CM

## 2019-10-29 LAB — URINALYSIS, ROUTINE W REFLEX MICROSCOPIC
Bacteria, UA: NONE SEEN
Bilirubin Urine: NEGATIVE
Glucose, UA: NEGATIVE mg/dL
Ketones, ur: 5 mg/dL — AB
Leukocytes,Ua: NEGATIVE
Nitrite: NEGATIVE
Protein, ur: 100 mg/dL — AB
Specific Gravity, Urine: 1.011 (ref 1.005–1.030)
pH: 7 (ref 5.0–8.0)

## 2019-10-29 LAB — COMPREHENSIVE METABOLIC PANEL
ALT: 10 U/L (ref 0–44)
AST: 15 U/L (ref 15–41)
Albumin: 4.3 g/dL (ref 3.5–5.0)
Alkaline Phosphatase: 53 U/L (ref 38–126)
Anion gap: 12 (ref 5–15)
BUN: <5 mg/dL — ABNORMAL LOW (ref 6–20)
CO2: 21 mmol/L — ABNORMAL LOW (ref 22–32)
Calcium: 9.5 mg/dL (ref 8.9–10.3)
Chloride: 103 mmol/L (ref 98–111)
Creatinine, Ser: 0.61 mg/dL (ref 0.44–1.00)
GFR calc Af Amer: >60 mL/min (ref >60)
GFR calc non Af Amer: >60 mL/min (ref >60)
Glucose, Bld: 122 mg/dL — ABNORMAL HIGH (ref 70–99)
Potassium: 3.6 mmol/L (ref 3.5–5.1)
Sodium: 136 mmol/L (ref 135–145)
Total Bilirubin: 0.4 mg/dL (ref 0.3–1.2)
Total Protein: 8.7 g/dL — ABNORMAL HIGH (ref 6.5–8.1)

## 2019-10-29 LAB — CBC
HCT: 39.7 % (ref 36.0–46.0)
Hemoglobin: 13.5 g/dL (ref 12.0–15.0)
MCH: 28.7 pg (ref 26.0–34.0)
MCHC: 34 g/dL (ref 30.0–36.0)
MCV: 84.3 fL (ref 80.0–100.0)
Platelets: 409 10*3/uL — ABNORMAL HIGH (ref 150–400)
RBC: 4.71 MIL/uL (ref 3.87–5.11)
RDW: 14.5 % (ref 11.5–15.5)
WBC: 15.5 10*3/uL — ABNORMAL HIGH (ref 4.0–10.5)
nRBC: 0 % (ref 0.0–0.2)

## 2019-10-29 LAB — I-STAT BETA HCG BLOOD, ED (MC, WL, AP ONLY): I-stat hCG, quantitative: <5 m[IU]/mL (ref <5)

## 2019-10-29 LAB — LIPASE, BLOOD: Lipase: 33 U/L (ref 11–51)

## 2019-10-29 LAB — SARS CORONAVIRUS 2 (TAT 6-24 HRS): SARS Coronavirus 2: NEGATIVE

## 2019-10-29 MED ORDER — ONDANSETRON 4 MG PO TBDP
4.0000 mg | ORAL_TABLET | Freq: Once | ORAL | Status: AC | PRN
  Administered 2019-10-29: 4 mg via ORAL
  Filled 2019-10-29: qty 1

## 2019-10-29 MED ORDER — HYDROCHLOROTHIAZIDE 12.5 MG PO CAPS
12.5000 mg | ORAL_CAPSULE | Freq: Every day | ORAL | Status: DC
  Administered 2019-10-29: 12.5 mg via ORAL
  Filled 2019-10-29: qty 1

## 2019-10-29 MED ORDER — IOHEXOL 300 MG/ML  SOLN
100.0000 mL | Freq: Once | INTRAMUSCULAR | Status: AC | PRN
  Administered 2019-10-29: 100 mL via INTRAVENOUS

## 2019-10-29 MED ORDER — SODIUM CHLORIDE 0.9 % IV SOLN
INTRAVENOUS | Status: DC
  Administered 2019-10-29: 15:00:00 via INTRAVENOUS

## 2019-10-29 MED ORDER — MORPHINE SULFATE (PF) 4 MG/ML IV SOLN
4.0000 mg | Freq: Once | INTRAVENOUS | Status: AC
  Administered 2019-10-29: 4 mg via INTRAVENOUS
  Filled 2019-10-29: qty 1

## 2019-10-29 MED ORDER — SODIUM CHLORIDE 0.9 % IV BOLUS
1000.0000 mL | Freq: Once | INTRAVENOUS | Status: AC
  Administered 2019-10-29: 1000 mL via INTRAVENOUS

## 2019-10-29 MED ORDER — ONDANSETRON HCL 4 MG/2ML IJ SOLN
4.0000 mg | Freq: Once | INTRAMUSCULAR | Status: AC
  Administered 2019-10-29: 4 mg via INTRAVENOUS
  Filled 2019-10-29: qty 2

## 2019-10-29 MED ORDER — LISINOPRIL 10 MG PO TABS
10.0000 mg | ORAL_TABLET | Freq: Once | ORAL | Status: AC
  Administered 2019-10-29: 10 mg via ORAL
  Filled 2019-10-29: qty 1

## 2019-10-29 NOTE — ED Notes (Signed)
Notified EDP of elevated bp.

## 2019-10-29 NOTE — ED Notes (Signed)
Patient transported to CT 

## 2019-10-29 NOTE — ED Provider Notes (Signed)
[facility]PART[facility] Provider Note   CSN: [id number] Arrival date & time: 10/29/19  1326     History   Chief Complaint Chief Complaint  Patient presents with  . Emesis  . Abdominal Pain    HPI Kerry Parker is a 43 y.o. female.     HPI Patient presents concern of abdominal pain, nausea, vomiting, no diarrhea. Onset was today, almost 10 hours prior to ED arrival.  She notes 1 prior similar episode earlier in the month, but no interval similar events. She states that she is generally well, denies history of abdominal surgery, denies recent notable events, clear precipitant for today's episode. Since onset earlier this morning she has had persistent nausea, anorexia, and has not developed diffuse abdominal discomfort, though initially she did not have this. No fever, no confusion, no chest pain, no dyspnea.  Not she has been unable to take any medication for relief. Past Medical History:  Diagnosis Date  . Hypertension     Patient Active Problem List   Diagnosis Date Noted  . Perineal rash in female 06/28/2017  . Strain of back 06/28/2017  . Depression with anxiety 03/05/2017  . TOBACCO ABUSE 08/29/2008  . HYPERTENSION, BENIGN ESSENTIAL 08/29/2008  . MASTITIS 08/29/2008    Past Surgical History:  Procedure Laterality Date  . BACK SURGERY       OB History   No obstetric history on file.      Home Medications    Prior to Admission medications   Medication Sig Start Date End Date Taking? Authorizing Provider  clotrimazole (LOTRIMIN) 1 % cream Apply 1 application topically 2 (two) times daily. 06/29/17   Clent Demark, PA-C  gabapentin (NEURONTIN) 300 MG capsule Take 1 capsule (300 mg total) by mouth 3 (three) times daily. 04/07/17   Clent Demark, PA-C  lisinopril-hydrochlorothiazide (PRINZIDE,ZESTORETIC) 10-12.5 MG tablet Take 1 tablet by mouth daily. 03/05/17   Clent Demark, PA-C  naproxen (NAPROSYN) 500 MG tablet  Take 1 tablet (500 mg total) by mouth 2 (two) times daily. 04/07/17   Clent Demark, PA-C  nystatin-triamcinolone ointment [facility]) Apply 1 application topically 2 (two) times daily. 06/28/17   Tresa Garter, MD  ondansetron (ZOFRAN ODT) 4 MG disintegrating tablet Take 1 tablet (4 mg total) by mouth every 8 (eight) hours as needed for nausea or vomiting. 02/10/18   Tasia Catchings, Amy V, PA-C  sertraline (ZOLOFT) 50 MG tablet Take 1 tablet (50 mg total) by mouth daily. Patient not taking: Reported on 06/28/2017 03/05/17   Clent Demark, PA-C    Family History No family history on file.  Social History Social History   Tobacco Use  . Smoking status: Current Every Day Smoker    Types: Cigarettes  . Smokeless tobacco: Never Used  Substance Use Topics  . Alcohol use: No  . Drug use: Yes    Types: Marijuana     Allergies   Patient has no known allergies.   Review of Systems Review of Systems  Constitutional:       Per HPI, otherwise negative  HENT:       Per HPI, otherwise negative  Respiratory:       Per HPI, otherwise negative  Cardiovascular:       Per HPI, otherwise negative  Gastrointestinal: Positive for abdominal pain, nausea and vomiting.  Endocrine:       Negative aside from HPI  Genitourinary:       Neg aside from HPI  Musculoskeletal:       Per HPI, otherwise negative  Skin: Negative.   Neurological: Negative for syncope.     Physical Exam Updated Vital Signs BP (!) 196/126   Pulse 74   Temp 98.9 F (37.2 C) (Oral)   Resp 16   LMP 10/22/2019   SpO2 100%   Physical Exam Vitals signs and nursing note reviewed.  Constitutional:      Appearance: She is well-developed. She is ill-appearing.     Comments: Uncomfortable appearing adult female sitting upright with a emesis bag full of greenish-yellowish material.  HENT:     Head: Normocephalic and atraumatic.  Eyes:     Conjunctiva/sclera: Conjunctivae normal.  Cardiovascular:     Rate and Rhythm:  Normal rate and regular rhythm.  Pulmonary:     Effort: Pulmonary effort is normal. No respiratory distress.     Breath sounds: Normal breath sounds. No stridor.  Abdominal:     General: There is no distension.     Tenderness: There is generalized abdominal tenderness.  Skin:    General: Skin is warm and dry.  Neurological:     Mental Status: She is alert and oriented to person, place, and time.     Cranial Nerves: No cranial nerve deficit.      ED Treatments / Results  Labs (all labs ordered are listed, but only abnormal results are displayed) Labs Reviewed  COMPREHENSIVE METABOLIC PANEL - Abnormal; Notable for the following components:      Result Value   CO2 21 (*)    Glucose, Bld 122 (*)    BUN <5 (*)    Total Protein 8.7 (*)    All other components within normal limits  CBC - Abnormal; Notable for the following components:   WBC 15.5 (*)    Platelets 409 (*)    All other components within normal limits  LIPASE, BLOOD  URINALYSIS, ROUTINE W REFLEX MICROSCOPIC  I-STAT BETA HCG BLOOD, ED ([facility], [facility], [facility] ONLY)    Radiology No results found.  Procedures Procedures (including critical care time)  Medications Ordered in ED Medications  sodium chloride 0.9 % bolus 1,000 mL (has no administration in time range)    And  0.9 %  sodium chloride infusion (has no administration in time range)  ondansetron (ZOFRAN) injection 4 mg (has no administration in time range)  morphine 4 MG/ML injection 4 mg (has no administration in time range)  ondansetron (ZOFRAN-ODT) disintegrating tablet 4 mg (4 mg Oral Given 10/29/19 1337)     Initial Impression / Assessment and Plan / ED Course  I have reviewed the triage vital signs and the nursing notes.  Pertinent labs & imaging results that were available during my care of the patient were reviewed by me and considered in my medical decision making (see chart for details).    Initial labs notable for leukocytosis. After initial  evaluation the patient received fluids, antiemetics, analgesia.  With concern for nausea, vomiting, abdominal pain, broad differential including hepatobiliary versus appendicitis versus diverticulitis, considered. Patient has no vaginal complaints, no urinary complaints, low suspicion for reproductive organ dysfunction, though this is a consideration. Lower suspicion for UTI as well given her diffuse discomfort, nausea vomiting, absence of urinary complaints.   This previously well adult female presents with nausea, vomiting, diffuse abdominal discomfort Patient is awake, alert, afebrile, hemodynamically unremarkable, but given the profound nature of her discomfort, the patient will have CT scan, after initial labs were notable for leukocytosis. Patient will  require additional monitoring, management, Dr. Anitra LauthPlunkett is aware of the patient, her condition, will follow as needed. Final Clinical Impressions(s) / ED Diagnoses   Final diagnoses:  Bilious vomiting with nausea  Generalized abdominal pain     Gerhard MunchLockwood, Janel Beane, MD 10/29/19 1504

## 2019-10-29 NOTE — ED Notes (Signed)
Pt reminded of the need for urine again. 

## 2019-10-29 NOTE — Discharge Instructions (Signed)
If the abdominal pain becomes worse, you are vomiting and not able to hold anything down or you start running a high fever or of any trouble breathing please return to the emergency room.

## 2019-10-29 NOTE — ED Notes (Signed)
Patient verbalizes understanding of discharge instructions. Opportunity for questioning and answers were provided. Armband removed by staff, pt discharged from ED.  

## 2019-10-29 NOTE — ED Notes (Signed)
Registration at bedside.

## 2019-10-29 NOTE — ED Triage Notes (Signed)
Patient c/o abdominal pain and emesis onset of 3:30am. States she had similar episode a few weeks ago and was tested negative for Covid.

## 2019-10-29 NOTE — ED Provider Notes (Signed)
Assumed care of patient at 1530 from Dr. Vanita Panda.  Patient pending CT scan.  CT shows no acute intra-abdominal or pelvic pathology.  Appendix is normal.  She does have a left complex ovarian follicle and potential fibroid but otherwise no acute findings.  Findings discussed with the patient.  She will be discharged home with supportive care.  Patient was Covid tested as she is high risk because she works at a [place].   Blanchie Dessert, MD 10/29/19 [phone]

## 2021-06-18 ENCOUNTER — Encounter (HOSPITAL_COMMUNITY): Payer: Self-pay | Admitting: *Deleted

## 2021-06-18 ENCOUNTER — Ambulatory Visit (HOSPITAL_COMMUNITY)
Admission: EM | Admit: 2021-06-18 | Discharge: 2021-06-18 | Disposition: A | Discharge status: Home / Self Care | Payer: Self-pay | Attending: Nurse Practitioner | Admitting: Nurse Practitioner

## 2021-06-18 ENCOUNTER — Other Ambulatory Visit: Payer: Self-pay

## 2021-06-18 DIAGNOSIS — H60502 Unspecified acute noninfective otitis externa, left ear: Secondary | ICD-10-CM

## 2021-06-18 DIAGNOSIS — H9202 Otalgia, left ear: Secondary | ICD-10-CM

## 2021-06-18 DIAGNOSIS — H6692 Otitis media, unspecified, left ear: Secondary | ICD-10-CM

## 2021-06-18 DIAGNOSIS — H6690 Otitis media, unspecified, unspecified ear: Secondary | ICD-10-CM

## 2021-06-18 MED ORDER — NEOMYCIN-POLYMYXIN-HC 3.5-10000-1 OT SUSP: 4.0000 [drp] | Freq: Three times a day (TID) | OTIC | 0 refills | Status: AC

## 2021-06-18 MED ORDER — AMOXICILLIN-POT CLAVULANATE 875-125 MG PO TABS: 1.0000 | ORAL_TABLET | Freq: Two times a day (BID) | ORAL | 0 refills | Status: AC

## 2021-06-18 MED ORDER — HYDROCODONE-ACETAMINOPHEN 5-325 MG PO TABS: 1.0000 | ORAL_TABLET | ORAL | 0 refills | Status: DC | PRN

## 2021-06-18 MED ORDER — IBUPROFEN 600 MG PO TABS: 600.0000 mg | ORAL_TABLET | Freq: Four times a day (QID) | ORAL | 0 refills | Status: AC | PRN

## 2021-06-18 MED ORDER — HYDROCODONE-ACETAMINOPHEN 5-325 MG PO TABS: 1.0000 | ORAL_TABLET | ORAL | 0 refills | Status: AC | PRN

## 2021-06-18 NOTE — ED Triage Notes (Signed)
Lt ear pain for 2 weeks

## 2021-06-18 NOTE — ED Provider Notes (Signed)
[facility]    CSN: [id number] Arrival date & time: 06/18/21  1002      History   Chief Complaint Chief Complaint  Patient presents with   Otalgia    HPI Kerry Parker is a 45 y.o. female.   Subjective:   Kerry Parker is a 45 y.o. female who presents for possible ear infection. Symptoms include left ear pain, plugged sensation in the left ear, and decreased hearing. Onset of symptoms was 2 weeks ago, gradually worsening since that time. The pain seems to radiate down into her face and neck. No swelling or drainage noted. She denies any fever, congestion, sinus pressure, runny nose, sore throat or other URI type symptoms. No neck pain or stiffness. She has tried OTC ear drops, sweet oil and a natural remedy for an herbal store without any relief in her symptoms.   The following portions of the patient's history were reviewed and updated as appropriate: allergies, current medications, past family history, past medical history, past social history, past surgical history, and problem list.       Past Medical History:  Diagnosis Date   Hypertension     Patient Active Problem List   Diagnosis Date Noted   Perineal rash in female 06/28/2017   Strain of back 06/28/2017   Depression with anxiety 03/05/2017   TOBACCO ABUSE 08/29/2008   HYPERTENSION, BENIGN ESSENTIAL 08/29/2008   MASTITIS 08/29/2008    Past Surgical History:  Procedure Laterality Date   BACK SURGERY      OB History   No obstetric history on file.      Home Medications    Prior to Admission medications   Medication Sig Start Date End Date Taking? Authorizing Provider  amoxicillin-clavulanate (AUGMENTIN) 875-125 MG tablet Take 1 tablet by mouth every 12 (twelve) hours. 06/18/21  Yes Lurline Idol, FNP  HYDROcodone-acetaminophen (NORCO/VICODIN) 5-325 MG tablet Take 1 tablet by mouth every 4 (four) hours as needed for severe pain. 06/18/21  Yes Lurline Idol, FNP  ibuprofen  (ADVIL) 600 MG tablet Take 1 tablet (600 mg total) by mouth every 6 (six) hours as needed (PAIN). 06/18/21  Yes Adonys Wildes, Lelon Mast, FNP  neomycin-polymyxin-hydrocortisone (CORTISPORIN) 3.5-10000-1 OTIC suspension Place 4 drops into the left ear 3 (three) times daily for 7 days. 06/18/21 06/25/21 Yes Lurline Idol, FNP  acetaminophen (TYLENOL) 500 MG tablet Take 1,500 mg by mouth every 6 (six) hours as needed for mild pain or headache.    [provider]  gabapentin (NEURONTIN) 300 MG capsule Take 1 capsule (300 mg total) by mouth 3 (three) times daily. Patient not taking: Reported on 10/29/2019 04/07/17   Loletta Specter, PA-C  lisinopril-hydrochlorothiazide (PRINZIDE,ZESTORETIC) 10-12.5 MG tablet Take 1 tablet by mouth daily. Patient not taking: Reported on 10/29/2019 03/05/17   Loletta Specter, PA-C  naproxen (NAPROSYN) 500 MG tablet Take 1 tablet (500 mg total) by mouth 2 (two) times daily. Patient not taking: Reported on 10/29/2019 04/07/17   Loletta Specter, PA-C  ondansetron [facility] ODT) 4 MG disintegrating tablet Take 1 tablet (4 mg total) by mouth every 8 (eight) hours as needed for nausea or vomiting. Patient not taking: Reported on 10/29/2019 02/10/18   Belinda Fisher, PA-C  sertraline (ZOLOFT) 50 MG tablet Take 1 tablet (50 mg total) by mouth daily. Patient not taking: Reported on 06/28/2017 03/05/17   Loletta Specter, PA-C    Family History History reviewed. No pertinent family history.  Social History Social History   Tobacco  Use   Smoking status: Every Day    Types: Cigarettes   Smokeless tobacco: Never  Substance Use Topics   Alcohol use: No   Drug use: Yes    Types: Marijuana     Allergies   Patient has no known allergies.   Review of Systems Review of Systems  Constitutional:  Negative for fever.  HENT:  Positive for ear pain and tinnitus. Negative for congestion, ear discharge, postnasal drip, rhinorrhea, sinus pressure and sore throat.   Neurological:   Negative for dizziness.  All other systems reviewed and are negative.   Physical Exam Triage Vital Signs ED Triage Vitals  Enc Vitals Group     BP 06/18/21 1145 (!) 158/111     Pulse Rate 06/18/21 1145 81     Resp 06/18/21 1145 20     Temp 06/18/21 1145 98.4 F (36.9 C)     Temp src --      SpO2 06/18/21 1145 100 %     Weight --      Height --      Head Circumference --      Peak Flow --      Pain Score 06/18/21 1147 9     Pain Loc --      Pain Edu? --      Excl. in GC? --    No data found.  Updated Vital Signs BP (!) 158/111   Pulse 81   Temp 98.4 F (36.9 C)   Resp 20   LMP  (LMP Unknown)   SpO2 100%   Visual Acuity Right Eye Distance:   Left Eye Distance:   Bilateral Distance:    Right Eye Near:   Left Eye Near:    Bilateral Near:     Physical Exam Vitals reviewed.  Constitutional:      General: She is not in acute distress.    Appearance: Normal appearance. She is not ill-appearing, toxic-appearing or diaphoretic.  HENT:     Head: Normocephalic.     Right Ear: Hearing, tympanic membrane, ear canal and external ear normal.     Left Ear: Hearing normal. Swelling and tenderness present.     Ears:     Comments: Pain, fluid and swelling of left ear noted. Unable to visualize TM due to swelling. No mastoid tenderness noted.  Cardiovascular:     Rate and Rhythm: Normal rate and regular rhythm.  Pulmonary:     Effort: Pulmonary effort is normal.     Breath sounds: Normal breath sounds.  Musculoskeletal:        General: Normal range of motion.     Cervical back: Normal range of motion and neck supple.  Lymphadenopathy:     Cervical: No cervical adenopathy.  Skin:    General: Skin is warm and dry.  Neurological:     General: No focal deficit present.     Mental Status: She is alert and oriented to person, place, and time.     UC Treatments / Results  Labs (all labs ordered are listed, but only abnormal results are displayed) Labs Reviewed - No  data to display  EKG   Radiology No results found.  Procedures Procedures (including critical care time)  Medications Ordered in UC Medications - No data to display  Initial Impression / Assessment and Plan / UC Course  I have reviewed the triage vital signs and the nursing notes.  Pertinent labs & imaging results that were available during my care of the  patient were reviewed by me and considered in my medical decision making (see chart for details).     45 yo female presenting with severe left ear pain x 2 weeks despite conservative measures. No indication of mastoiditis. No fever, headache or neck stiffness. Will treat with oral antibiotics, ear gtts and pain mediations. Return precautions discussed.   Today's evaluation has revealed no signs of a dangerous process. Discussed diagnosis with patient and/or guardian. Patient and/or guardian aware of their diagnosis, possible red flag symptoms to watch out for and need for close follow up. Patient and/or guardian understands verbal and written discharge instructions. Patient and/or guardian comfortable with plan and disposition.  Patient and/or guardian has a clear mental status at this time, good insight into illness (after discussion and teaching) and has clear judgment to make decisions regarding their care  This care was provided during an unprecedented National Emergency due to the Novel Coronavirus (COVID-19) pandemic. COVID-19 infections and transmission risks place heavy strains on healthcare resources.  As this pandemic evolves, our facility, providers, and staff strive to respond fluidly, to remain operational, and to provide care relative to available resources and information. Outcomes are unpredictable and treatments are without well-defined guidelines. Further, the impact of COVID-19 on all aspects of urgent care, including the impact to patients seeking care for reasons other than COVID-19, is unavoidable during this national  emergency. At this time of the global pandemic, management of patients has significantly changed, even for non-COVID positive patients given high local and regional COVID volumes at this time requiring high healthcare system and resource utilization. The standard of care for management of both COVID suspected and non-COVID suspected patients continues to change rapidly at the local, regional, national, and global levels. This patient was worked up and treated to the best available but ever changing evidence and resources available at this current time.   Documentation was completed with the aid of voice recognition software. Transcription may contain typographical errors. Final Clinical Impressions(s) / UC Diagnoses   Final diagnoses:  Otalgia of left ear  Acute otitis externa of left ear, unspecified type  Acute otitis media, unspecified otitis media type     Discharge Instructions      Take mediations as prescribed.  Do not stop taking the antibiotic even if you start to feel better. Go to ED immediately if get a stiff neck, cannot move part of your face, you notice that the bone behind your ear hurts when you touch it or you get a very bad headache.     ED Prescriptions     Medication Sig Dispense Auth. Provider   amoxicillin-clavulanate (AUGMENTIN) 875-125 MG tablet Take 1 tablet by mouth every 12 (twelve) hours. 14 tablet Lurline Idol, FNP   HYDROcodone-acetaminophen (NORCO/VICODIN) 5-325 MG tablet Take 1 tablet by mouth every 4 (four) hours as needed for severe pain. 10 tablet Lurline Idol, FNP   ibuprofen (ADVIL) 600 MG tablet Take 1 tablet (600 mg total) by mouth every 6 (six) hours as needed (PAIN). 30 tablet Elmer Merwin, [place], FNP   neomycin-polymyxin-hydrocortisone (CORTISPORIN) 3.5-10000-1 OTIC suspension Place 4 drops into the left ear 3 (three) times daily for 7 days. 4.2 mL Lurline Idol, FNP      I have reviewed the PDMP during this encounter.    Lurline Idol, [place] 06/18/21 1241

## 2021-06-18 NOTE — Discharge Instructions (Addendum)
Take mediations as prescribed.  Do not stop taking the antibiotic even if you start to feel better. Go to ED immediately if get a stiff neck, cannot move part of your face, you notice that the bone behind your ear hurts when you touch it or you get a very bad headache.

## 2024-03-08 ENCOUNTER — Ambulatory Visit (INDEPENDENT_AMBULATORY_CARE_PROVIDER_SITE_OTHER)

## 2024-03-08 ENCOUNTER — Encounter (HOSPITAL_COMMUNITY): Payer: Self-pay

## 2024-03-08 ENCOUNTER — Ambulatory Visit (HOSPITAL_COMMUNITY)
Admission: EM | Admit: 2024-03-08 | Discharge: 2024-03-08 | Disposition: A | Discharge status: Home / Self Care | Attending: Sports Medicine | Admitting: Sports Medicine

## 2024-03-08 DIAGNOSIS — M25512 Pain in left shoulder: Secondary | ICD-10-CM

## 2024-03-08 DIAGNOSIS — M25562 Pain in left knee: Secondary | ICD-10-CM | POA: Diagnosis not present

## 2024-03-08 DIAGNOSIS — M25572 Pain in left ankle and joints of left foot: Secondary | ICD-10-CM | POA: Diagnosis not present

## 2024-03-08 DIAGNOSIS — M25522 Pain in left elbow: Secondary | ICD-10-CM

## 2024-03-08 DIAGNOSIS — W19XXXA Unspecified fall, initial encounter: Secondary | ICD-10-CM

## 2024-03-08 MED ORDER — MELOXICAM 7.5 MG PO TABS: 15.0000 mg | ORAL_TABLET | Freq: Every day | ORAL | 0 refills | Status: AC

## 2024-03-08 NOTE — Discharge Instructions (Signed)
 Your x-rays of the ankle and knee both were normal on my read. No fractures. We are waiting for the formal radiology read, so if they see something I don't I will call you.  Stay in the ankle boot for the sprain for 5-7 days, but as soon as you are reasonably able I would like for you to transition out of the boot into an ASO brace. This could be purchased online or they can get you one at the orthopedic office when you see them.  Try to take it relatively easy over the next week. I recommend icing your sore joints for 15-20 minutes 2-3 times per day. For pain take meloxicam 15mg  once daily (or if this is too pricey at the pharmacy do ibuprofen 600-800mg  every 8 hours) You can take tylenol with this (1000mg  every 8 hours) - avoid alcohol with this medication.  You should hear about orthopedic follow-up in the next couple weeks.

## 2024-03-08 NOTE — ED Triage Notes (Signed)
 Pt present fall yesterday with injury/ pain in left Knee, shoulder, elbow, ankle. Pt states unable to do any range of motion.

## 2024-03-08 NOTE — ED Provider Notes (Signed)
 [facility]    CSN: [id number] Arrival date & time: 03/08/24  5643      History   Chief Complaint Chief Complaint  Patient presents with   Fall   Knee Injury    HPI Kerry Parker is a 48 y.o. female with h/o depression and axiety, HTN, and tobacco use her with acute left ankle, knee, shoulder and elbow pain in the setting of a fall yesterday. She states she was walking and rolled her left ankle in causing her to fall. She landed on the front of her left knee and on her left arm. She cannot actively extend her left knee 2/2 pain, but can use her hands to extend the leg. Hasn't been able to weight bear on the left leg since the injury. Is using a makeshift cane for ambulatory assistance, but in a wheelchair here since she is unable to bear weight on the leg. Denies striking her head or LOC. She did scrape her knee on the pavement and has a bandage on that now. She hasn't iced anything or taken any medications thus far. No previous ankle, knee, shoulder or elbow injuries or surgeries per the patient.   Fall    Past Medical History:  Diagnosis Date   Hypertension     Patient Active Problem List   Diagnosis Date Noted   Perineal rash in female 06/28/2017   Strain of back 06/28/2017   Depression with anxiety 03/05/2017   TOBACCO ABUSE 08/29/2008   HYPERTENSION, BENIGN ESSENTIAL 08/29/2008   MASTITIS 08/29/2008    Past Surgical History:  Procedure Laterality Date   BACK SURGERY      OB History   No obstetric history on file.      Home Medications    Prior to Admission medications   Medication Sig Start Date End Date Taking? Authorizing Provider  meloxicam (MOBIC) 7.5 MG tablet Take 2 tablets (15 mg total) by mouth daily. 03/08/24  Yes Marisa Cyphers, MD  acetaminophen (TYLENOL) 500 MG tablet Take 1,500 mg by mouth every 6 (six) hours as needed for mild pain or headache.    [provider]  amoxicillin-clavulanate (AUGMENTIN) 875-125 MG tablet  Take 1 tablet by mouth every 12 (twelve) hours. 06/18/21   Lurline Idol, FNP  gabapentin (NEURONTIN) 300 MG capsule Take 1 capsule (300 mg total) by mouth 3 (three) times daily. Patient not taking: Reported on 10/29/2019 04/07/17   Loletta Specter, PA-C  HYDROcodone-acetaminophen (NORCO/VICODIN) 5-325 MG tablet Take 1 tablet by mouth every 4 (four) hours as needed for severe pain. 06/18/21   Lurline Idol, FNP  ibuprofen (ADVIL) 600 MG tablet Take 1 tablet (600 mg total) by mouth every 6 (six) hours as needed (PAIN). 06/18/21   Lurline Idol, FNP  lisinopril-hydrochlorothiazide (PRINZIDE,ZESTORETIC) 10-12.5 MG tablet Take 1 tablet by mouth daily. Patient not taking: Reported on 10/29/2019 03/05/17   Loletta Specter, PA-C  ondansetron [facility] ODT) 4 MG disintegrating tablet Take 1 tablet (4 mg total) by mouth every 8 (eight) hours as needed for nausea or vomiting. Patient not taking: Reported on 10/29/2019 02/10/18   Belinda Fisher, PA-C  sertraline (ZOLOFT) 50 MG tablet Take 1 tablet (50 mg total) by mouth daily. Patient not taking: Reported on 06/28/2017 03/05/17   Loletta Specter, PA-C    Family History History reviewed. No pertinent family history.  Social History Social History   Tobacco Use   Smoking status: Every Day    Types: Cigarettes   Smokeless  tobacco: Never  Substance Use Topics   Alcohol use: No   Drug use: Yes    Types: Marijuana     Allergies   Patient has no known allergies.   Review of Systems Review of Systems   Physical Exam Triage Vital Signs ED Triage Vitals  Encounter Vitals Group     BP 03/08/24 1144 (!) 149/99     Systolic BP Percentile --      Diastolic BP Percentile --      Pulse Rate 03/08/24 1144 81     Resp 03/08/24 1144 16     Temp 03/08/24 1144 98.3 F (36.8 C)     Temp Source 03/08/24 1144 Oral     SpO2 03/08/24 1144 100 %     Weight --      Height --      Head Circumference --      Peak Flow --      Pain Score 03/08/24  1141 10     Pain Loc --      Pain Education --      Exclude from Growth Chart --    No data found.  Updated Vital Signs BP (!) 149/99 (BP Location: Right Arm)   Pulse 81   Temp 98.3 F (36.8 C) (Oral)   Resp 16   LMP 02/26/2024   SpO2 100%   Visual Acuity Right Eye Distance:   Left Eye Distance:   Bilateral Distance:    Right Eye Near:   Left Eye Near:    Bilateral Near:     Physical Exam Constitutional:      Appearance: Normal appearance. She is not ill-appearing or toxic-appearing.     Comments: Uncomfortable in appearance  HENT:     Head: Normocephalic and atraumatic.     Right Ear: Tympanic membrane, ear canal and external ear normal.     Left Ear: Tympanic membrane, ear canal and external ear normal.  Eyes:     Extraocular Movements: Extraocular movements intact.     Conjunctiva/sclera: Conjunctivae normal.     Pupils: Pupils are equal, round, and reactive to light.  Cardiovascular:     Rate and Rhythm: Normal rate.     Pulses: Normal pulses.  Pulmonary:     Effort: Pulmonary effort is normal.  Musculoskeletal:     Cervical back: Normal range of motion and neck supple. No tenderness.     Comments: Left Shoulder: No swelling, ecchymoses.  No gross deformity. TTP over posterolateral shoulder AROM limited to 0-100d, but passively can get to 170d. Strength 4/5 with empty can and 4+/5 resisted internal/external rotation - all of which reproduces pain in the shoulder. NV intact distally.  Left Elbow: No deformity, swelling or ecchymosis TTP over the olecranon, but remainder non-tender ROM is full and strength is full. NVI distally with full ROM of the wrist and fingers.  Left Knee: No gross deformity, ecchymoses, swelling. Superficial abrasion over the patella. TTP over the patella, proximal patella tendon, and lateral knee joint. Posterior knee non-tender. ROM significantly with loss of active extensor mechanism. Negative ant/post drawers. Negative  valgus/varus testing, though pain with varus stress. NV intact distally.  Left Foot and Ankle Exam: Significant lateral edema. No bruising or deformity.  ROM limited by pain and swelling. Strength limited with dorsiflexion and resisted eversion. TTP along lateral malleolus and talar dome. No TTP along achilles, medial malleolus, calcaneus, navicular, base of 5th MT, midfoot or forefoot. Unable to bear weight on left foot. Positive Tib-Fib  Squeeze Test Negative thompsons. NVI distally.   Skin:    Capillary Refill: Capillary refill takes less than 2 seconds.  Neurological:     General: No focal deficit present.     Mental Status: She is alert and oriented to person, place, and time.    UC Treatments / Results  Labs (all labs ordered are listed, but only abnormal results are displayed) Labs Reviewed - No data to display  EKG   Radiology No results found.  Procedures Procedures (including critical care time)  Medications Ordered in UC Medications - No data to display  Initial Impression / Assessment and Plan / UC Course  I have reviewed the triage vital signs and the nursing notes.  Pertinent labs & imaging results that were available during my care of the patient were reviewed by me and considered in my medical decision making (see chart for details).   Vitals and triage reviewed, patient is hemodynamically stable.   Acute pain of left knee - Plan: DG Knee AP/LAT W/Sunrise Left, DG Knee AP/LAT W/Sunrise Left  Acute left ankle pain - Plan: DG Ankle Complete Left, DG Ankle Complete Left  Acute pain of left shoulder  Left elbow pain  Fall, initial encounter  X-rays of the knee and ankle were reviewed by me and show no fractures or acute bony abnormalities. Formal radiology read pending.  Ankle with severe ankle sprain. Will place her in a CAM boot for 5-7d and provide her crutches for ambulation assistance. Encouraged transition out in about 1 week to an ASO brace to  try to maintain mobility. Rx for Mobic 15mg  every day (or substitute for ibuprofen 600-800mg  TID if not covered at pharmacy) to take with Tylenol 1g TID in addition to icing.  Knee most likely a sprain or contusion. Encouraged icing and NSAIDs.   Shoulder pain most likely rotator cuff strain and elbow most likely contusion. Given motion low concern for fracture or bony abnormality, so imaging was deferred. NSAIDs and ice and orthopedic follow-up.  Return and ER precautions discussed Patient's questions were answered and they are in agreement with this plan  Final Clinical Impressions(s) / UC Diagnoses   Final diagnoses:  Acute pain of left knee  Acute left ankle pain  Acute pain of left shoulder  Left elbow pain  Fall, initial encounter     Discharge Instructions      Your x-rays of the ankle and knee both were normal on my read. No fractures. We are waiting for the formal radiology read, so if they see something I don't I will call you.  Stay in the ankle boot for the sprain for 5-7 days, but as soon as you are reasonably able I would like for you to transition out of the boot into an ASO brace. This could be purchased online or they can get you one at the orthopedic office when you see them.  Try to take it relatively easy over the next week. I recommend icing your sore joints for 15-20 minutes 2-3 times per day. For pain take meloxicam 15mg  once daily (or if this is too pricey at the pharmacy do ibuprofen 600-800mg  every 8 hours) You can take tylenol with this (1000mg  every 8 hours) - avoid alcohol with this medication.  You should hear about orthopedic follow-up in the next couple weeks.     ED Prescriptions     Medication Sig Dispense Auth. Provider   meloxicam (MOBIC) 7.5 MG tablet Take 2 tablets (15 mg total)  by mouth daily. 60 tablet Marisa Cyphers, MD      PDMP not reviewed this encounter.   Marisa Cyphers, MD 03/08/24 1311
